# Patient Record
Sex: Male | Born: 1945 | Race: White | Hispanic: No | Marital: Married
Health system: Southern US, Community
[De-identification: ages and names within clinical notes are randomized; demographics above are authoritative.]

---

## 1999-05-17 ENCOUNTER — Ambulatory Visit (HOSPITAL_COMMUNITY): Admission: RE | Admit: 1999-05-17 | Discharge: 1999-05-17 | Payer: Self-pay | Admitting: Orthopedic Surgery

## 1999-05-17 ENCOUNTER — Encounter: Payer: Self-pay | Admitting: Orthopedic Surgery

## 1999-06-13 ENCOUNTER — Ambulatory Visit (HOSPITAL_COMMUNITY): Admission: RE | Admit: 1999-06-13 | Discharge: 1999-06-13 | Payer: Self-pay | Admitting: Orthopedic Surgery

## 1999-06-13 ENCOUNTER — Encounter: Payer: Self-pay | Admitting: Orthopedic Surgery

## 1999-06-29 ENCOUNTER — Ambulatory Visit (HOSPITAL_COMMUNITY): Admission: RE | Admit: 1999-06-29 | Discharge: 1999-06-29 | Payer: Self-pay | Admitting: Orthopedic Surgery

## 1999-06-29 ENCOUNTER — Encounter: Payer: Self-pay | Admitting: Orthopedic Surgery

## 1999-07-19 ENCOUNTER — Ambulatory Visit (HOSPITAL_COMMUNITY): Admission: RE | Admit: 1999-07-19 | Discharge: 1999-07-19 | Payer: Self-pay | Admitting: Orthopedic Surgery

## 1999-07-19 ENCOUNTER — Encounter: Payer: Self-pay | Admitting: Orthopedic Surgery

## 1999-08-16 ENCOUNTER — Encounter: Payer: Self-pay | Admitting: Neurosurgery

## 1999-08-16 ENCOUNTER — Ambulatory Visit (HOSPITAL_COMMUNITY): Admission: RE | Admit: 1999-08-16 | Discharge: 1999-08-16 | Payer: Self-pay | Admitting: Neurosurgery

## 1999-11-07 ENCOUNTER — Encounter: Payer: Self-pay | Admitting: Neurosurgery

## 1999-11-07 ENCOUNTER — Encounter: Admission: RE | Admit: 1999-11-07 | Discharge: 1999-11-07 | Payer: Self-pay | Admitting: Neurosurgery

## 1999-11-22 ENCOUNTER — Encounter: Payer: Self-pay | Admitting: Neurosurgery

## 1999-11-23 ENCOUNTER — Inpatient Hospital Stay (HOSPITAL_COMMUNITY): Admission: RE | Admit: 1999-11-23 | Discharge: 1999-11-23 | Payer: Self-pay | Admitting: Neurosurgery

## 1999-11-23 ENCOUNTER — Encounter: Payer: Self-pay | Admitting: Neurosurgery

## 2008-09-30 ENCOUNTER — Ambulatory Visit (HOSPITAL_COMMUNITY): Admission: RE | Admit: 2008-09-30 | Discharge: 2008-09-30 | Payer: Self-pay | Admitting: General Surgery

## 2010-12-12 LAB — COMPREHENSIVE METABOLIC PANEL
ALT: 35 U/L (ref 0–53)
AST: 29 U/L (ref 0–37)
Albumin: 4.3 g/dL (ref 3.5–5.2)
Alkaline Phosphatase: 50 U/L (ref 39–117)
Chloride: 101 mEq/L (ref 96–112)
GFR calc Af Amer: >60 mL/min (ref >60)
Potassium: 3.6 mEq/L (ref 3.5–5.1)
Total Bilirubin: 1.3 mg/dL — ABNORMAL HIGH (ref 0.3–1.2)

## 2010-12-12 LAB — CBC
Platelets: 142 10*3/uL — ABNORMAL LOW (ref 150–400)
WBC: 4.7 10*3/uL (ref 4.0–10.5)

## 2010-12-12 LAB — DIFFERENTIAL
Basophils Absolute: 0 10*3/uL (ref 0.0–0.1)
Basophils Relative: 0 % (ref 0–1)
Eosinophils Relative: 2 % (ref 0–5)
Lymphocytes Relative: 28 % (ref 12–46)
Monocytes Absolute: 0.3 10*3/uL (ref 0.1–1.0)

## 2011-01-09 NOTE — Op Note (Signed)
[name], Jonathan Knight                ACCOUNT NO.:  [id number]   MEDICAL RECORD NO.:  [id number]          PATIENT TYPE:  AMB   LOCATION:  DAY                          FACILITY:  [facility]   PHYSICIAN:  Adolph Pollack, M.D.DATE OF BIRTH:  28-May-1946   DATE OF PROCEDURE:  09/30/2008  DATE OF DISCHARGE:                               OPERATIVE REPORT   PREOPERATIVE DIAGNOSIS:  Bilateral inguinal hernias with left side being  recurrent.   POSTOPERATIVE DIAGNOSIS:  Bilateral inguinal hernias with left side  being recurrent.   PROCEDURE:  Laparoscopic bilateral inguinal hernia repair with mesh.   SURGEON:  Avel Peace, MD   ANESTHESIA:  General.   INDICATIONS:  This is a 65 year old male who had a left inguinal hernia  repair over 20 years ago.  He says for the past 15 years he has noticed  a recurrence of the hernia and now has a little bit of discomfort on the  right side.  On physical examination, he has a recurrent left inguinal  hernia and a new right inguinal hernia.  He now presents for  laparoscopic repair.   TECHNIQUE:  He voided then was brought to the operating room, placed  supine on the operating table, and a general anesthetic was  administered.  The hair in the lower abdomen and groin was clipped and  the area sterilely prepped and draped.  Marcaine was infiltrated in the  subumbilical region.  A subumbilical incision was made through the skin  and subcutaneous tissue.  The right anterior rectus sheath was  identified and a small incision made in it.  The right rectus muscle was  swept laterally exposing the posterior rectus sheath.  A balloon  dissection device was then placed in the extraperitoneal space under  laparoscopic vision. Balloon dissection was performed of the right and  left extraperitoneal space.  Following this, the balloon was removed and  CO2 gas was insufflated.  Under direct vision two 5-mm trocars were  placed in the lower midline.   I  approached the right side and identified the symphysis pubis and  Cooper's ligament.  Using blunt dissection, I separated the fibrofatty  tissue away from the lateral and anterior abdominal wall to the level of  the umbilicus.  The direct space was solid.  I then isolated the  spermatic cord and noted an indirect hernia sac.  I used careful blunt  dissection to strip the sac away from the cord back to the level of the  umbilicus and create a window around the cord.   I then approached the left side and noted a recurrent hernia.  I reduced  this with external manual compression.  I then dissected the fibrofatty  tissue free from the anterior and lateral abdominal wall.  I then began  dissecting the hernia contents free and it looked like it was a direct  defect.  There is a small tear made in the peritoneum creating a  pneumoperitoneum and this was reduced with a Veress needle placed next  to the original subumbilical trocar.  I was able to fold the  peritoneum  back on itself and seal the defect.  I then removed the extraperitoneal  fatty contents and hernia contents free from the direct sac and reduced  this back into the extraperitoneal space. I then isolated the spermatic  cord vessels and created a window around them.  I stripped some  peritoneum back to the level of the umbilicus.  I then used blunt  dissection to expose Cooper's ligament.   Following this, I brought up a 6 x 6 inch piece of Parietex TECR  mesh  into the field and cut it to be 5 inches x 6 inches.  A partial  longitudinal slit was cut into it.  It was then placed into left  extraperitoneal space and positioned so that the 2 tails were wrapped  around the cord.  It was then anchored to Cooper's ligament and the  anterior and lateral abdominal walls with spiral tacks.  This provided  for adequate coverage with good overlap of the direct, indirect, and  femoral spaces.   Following this, I took a 6 x 6 inch piece of  Parietex TECR mesh and cut  it to be 5 x 6.  A third cut a partial longitudinal slit into it as well  and placed it into the right extraperitoneal space.  It was positioned  so that the 2 tails were wrapped around the spermatic cord.  It was then  anchored to Cooper's ligament and the anterior and lateral abdominal  walls with spiral tacks.  It provided for adequate coverage with good  overlap of the direct, indirect, and femoral spaces.   I then inspected the area and evacuated some old blood.  No active  bleeding was noted.  The inferolateral aspect of ease piece of mesh was  then held with a blunt instrument and the CO2 gas released.  The  instruments were removed. The Veress needle was removed, and all trocars  were removed.   The right anterior rectus sheath defect was closed with interrupted zero  Vicryl sutures.  The skin incisions were closed with 4-0 Monocryl  subcuticular stitches.  Steri-Strips and sterile dressings were applied.  He tolerated the procedure without any apparent complications and was  taken to recovery room in satisfactory condition.      Adolph Pollack, M.D.  Electronically Signed     TJR/MEDQ  D:  09/30/2008  T:  09/30/2008  Job:  [id number]   cc:   Oley Balm. Georgina Pillion, M.D.  Fax: [phone]

## 2011-01-12 NOTE — H&P (Signed)
[place]. [facility]  Patient:    Jonathan Knight                          MRN: [id number] Adm. Date:  [id number] Disc. Date: [id number] Attending:  Colon Branch                         History and Physical  CHIEF COMPLAINT:  Right leg pain.  HISTORY:  The patient is a 65 year old right-handed gentleman who, one year ago, started having some back pain and then, over the summer, started having some pain in the right hip and down his right leg towards the lateral ankle.  He ended up  getting an MRI of his lumbar spine and then underwent epidural steroid injections, which did give him some temporary relief.  The pain continued, along with some tingling, towards the top of his foot and big toe.  We started seeing him in December.  Myelogram of his lumbar spine was obtained.  He was treated with nonsteroidal anti-inflammatories and things did start improving a bit again. This happened until February, where he started having a terrible right leg pain, unrelenting, even while back on the Naprosyn.  Mattie Marlin helped things a little but the pain then worsened and a burning pain in the calf also started.  He started have a little weakness in ______ hallucis longus and the patient will be admitted for surgery.  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  There is no movement of the area either with flexion and extension.  The patient is going to be admitted for surgical decompression.  PAST MEDICAL HISTORY:  Significant for hypertension and high cholesterol.  PREVIOUS OPERATIONS:  Hernia repair in 1987.  CURRENT MEDICATIONS:  Captopril, Lipitor and Naprosyn p.r.n.  ALLERGIES:  No known drug allergies.  SOCIAL HISTORY:  She is married.  She does not smoke; drinks alcohol occasionally.  REVIEW OF SYSTEMS:  Otherwise negative.  FAMILY  HISTORY:  Noncontributory.  PHYSICAL EXAMINATION:  GENERAL:  The patient is pleasant, but is in some mild distress.  HEENT:  Unremarkable.  NECK:  Supple.  LUNGS:  Clear.  HEART:  Regular rhythm.  ABDOMEN:  Soft, nontender.  EXTREMITIES:  Intact; no edema.  BACK/NEUROLOGIC:  Exam shows decreased range of motion of the back, especially where there is lateral bending because of radicular pain.  There is a positive straight leg raise on the right and negative on the left.  Sensation is slightly decreased in the right L5 distribution.  There is some slight weakness in the right ______ hallucis longus.  Dorsiflexion seems intact.  He can ambulate on heels, with his toes up.  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  PLAN:  The patient will be admitted for semi-hemilaminectomy and removal of epidural mass. DD:  11/22/99 TD:  11/22/99 Job: [id number]

## 2011-01-12 NOTE — Op Note (Signed)
[place]. [facility]  Patient:    Jonathan Knight, Jonathan Knight                         MRN: [id number] Proc. Date: 11/23/99 Adm. 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  POSTOPERATIVE DIAGNOSIS:  Right L4-5 synovial cyst cause a right L5 radiculopathy.  OPERATION PERFORMED:  Right L4-5 semihemilaminectomy and removal of epidural mass/synovial cyst with microscope for microdissection.  SURGEON:  Clydene Fake, M.D.  ASSISTANT:  Izell Ettrick. Elesa Hacker, M.D.  ANESTHESIA:  General endotracheal.  ESTIMATED BLOOD LOSS:  ____________  BLOOD GIVEN:  None.  DRAINS:  None.  COMPLICATIONS:  None.  INDICATIONS FOR PROCEDURE:  The patient is a 65 year old gentleman who has had ____________ right leg pain going to the top of his foot which has had temporary relief with epidural steroids and oral steroids, nonsteroidal anti-inflammatories. He continues having recurrent episodes and each time having worsening symptoms which is worsening weakness, starting to get extensor hallucis longus weakness nd more dense numbness.  MRI and then myelogram of the lumbar spine shows synovial  cyst of L4-5 on the right compressing the L5 root.  There was mild spondylolisthesis at level that does not move with flexion and extension. Patient brought in for decompression with removal of the mass.  DESCRIPTION OF PROCEDURE:  The patient was brought to the operating room and general anesthesia was induced.  The patient was placed in a Wilson frame with ll pressure points padded in a prone position.  The patient was prepped and draped in a sterile fashion.  The site of incision was then injected with 10 cc of 1% lidocaine with epinephrine.  A needle was placed in the midline in the interspace. An x-ray was obtained showing this was the 3-4 interspace.  An incision  was then made centered one interspace lower in the midline in the lumbar spine. Hemostasis was obtained with Bovie cauterization.  The fascia was incised over the L4 and  spinous processes and subperiosteal dissection was done over the L4 and L5 spinous processes of the lamina out to the facet.  Marker was placed showing this was the 4-5 interspace.  The self-retaining retractor system was then replaced. Microscope was brought into the field to perform microdissection and a Midas Rex drill was  used along with Kerrison punches to perform a semihemilaminectomy in the L4-5 area. The ligamentum flavum was then removed and then re-explored lateral area of the 5 root and found synovial cyst which was removed piecemeal.  After decompression f that, foraminotomy was done on the 5 root.  We explored the disk space.  There as just a mild bulge.  The disk was left intact.  Hemostasis in the epidural space was obtained with bipolar cauterization and Gelfoam and thrombin.  There was no pressure on the L5 root.  The foramen was wide open and the 4 root was also decompressed.  Gelfoam was irrigated out with antibiotic solution and then the retractor removed.  Microscope removed from the field and the fascia closed with 0 Vicryl interrupted sutures and the subcutaneous tissues closed with 0, 2-0 and -0 Vicryl interrupted sutures.  Steri-Strips were then placed.  ____________ placed. Patient placed back in a  supine position, awakened from anesthesia and transferred to the recovery room in stable condition. DD:  11/23/99 TD:  11/23/99 Job: [id number]

## 2011-01-12 NOTE — Discharge Summary (Signed)
[place]. [facility]  Patient:    SALAHUDDIN, ARISMENDEZ                         MRN: [id number] Adm. Date:  [id number] Disc. 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  PROCEDURE:  Right L4-5 semi-hemilaminectomy and removal of epidural mass (synovial cyst) with microscope for microdissection.  HISTORY OF PRESENT ILLNESS:  Patient is a 65 year old gentleman who has had, for about 9 months, multiple episodes of extreme right leg that resolves with steroids or nonsteroidal anti-inflammatories using epidural steroid injections.  It gets  better and things recur.  Each recurrence, symptoms are worse and, with this last recurrence, he is getting more numbness into the foot and some weakness in the extensor hallucis longus.  MRI and myelogram show a synovial cyst coming out of the joint at the right 4-5 ______ compressing the 5 root there on the right side. e also has mild spondylolisthesis there that does not move with flexion and extension.  Patient was admitted for surgical decompression.  HOSPITAL COURSE:  Patient was admitted the day of surgery and underwent the procedure without complications.  Postoperatively, patient was transferred to the recovery room and then to the floor.  There, he has been ambulating.  He has no leg pain, numbness, or weakness.  He is eating well, ambulating well, with minimal incisional pain, and dressings clean, dry, and intact.  He is going to be discharged home on November 23, 1999, in stable condition.  DISCHARGE MEDICATIONS:  Same as prehospitalization, plus hydrocodone p.r.n. and  Flexeril p.r.n.  DISCHARGE INSTRUCTIONS:  Diet as tolerated.  No sitting longer than 20 minutes. No bending, twisting, lifting.  No driving.  Keep incision dry for five days. Follow-up will be three weeks in my office. DD:  11/23/99 TD:  11/24/99 Job:  [id number]

## 2012-06-05 ENCOUNTER — Other Ambulatory Visit: Payer: Self-pay | Admitting: Family Medicine

## 2012-06-05 ENCOUNTER — Ambulatory Visit
Admission: RE | Admit: 2012-06-05 | Discharge: 2012-06-05 | Disposition: A | Discharge status: Home / Self Care | Payer: BC Managed Care – PPO | Source: Ambulatory Visit | Attending: Family Medicine | Admitting: Family Medicine

## 2012-06-05 DIAGNOSIS — R52 Pain, unspecified: Secondary | ICD-10-CM

## 2015-12-07 DIAGNOSIS — L723 Sebaceous cyst: Secondary | ICD-10-CM | POA: Diagnosis not present

## 2015-12-07 DIAGNOSIS — L570 Actinic keratosis: Secondary | ICD-10-CM | POA: Diagnosis not present

## 2015-12-07 DIAGNOSIS — Z85828 Personal history of other malignant neoplasm of skin: Secondary | ICD-10-CM | POA: Diagnosis not present

## 2015-12-07 DIAGNOSIS — L821 Other seborrheic keratosis: Secondary | ICD-10-CM | POA: Diagnosis not present

## 2015-12-07 DIAGNOSIS — D2361 Other benign neoplasm of skin of right upper limb, including shoulder: Secondary | ICD-10-CM | POA: Diagnosis not present

## 2016-01-16 DIAGNOSIS — E7800 Pure hypercholesterolemia, unspecified: Secondary | ICD-10-CM | POA: Diagnosis not present

## 2016-01-16 DIAGNOSIS — I10 Essential (primary) hypertension: Secondary | ICD-10-CM | POA: Diagnosis not present

## 2016-01-16 DIAGNOSIS — Z125 Encounter for screening for malignant neoplasm of prostate: Secondary | ICD-10-CM | POA: Diagnosis not present

## 2016-01-16 DIAGNOSIS — R7303 Prediabetes: Secondary | ICD-10-CM | POA: Diagnosis not present

## 2016-01-16 DIAGNOSIS — Z0000 Encounter for general adult medical examination without abnormal findings: Secondary | ICD-10-CM | POA: Diagnosis not present

## 2016-03-27 DIAGNOSIS — K621 Rectal polyp: Secondary | ICD-10-CM | POA: Diagnosis not present

## 2016-03-27 DIAGNOSIS — K5730 Diverticulosis of large intestine without perforation or abscess without bleeding: Secondary | ICD-10-CM | POA: Diagnosis not present

## 2016-03-27 DIAGNOSIS — D128 Benign neoplasm of rectum: Secondary | ICD-10-CM | POA: Diagnosis not present

## 2016-03-27 DIAGNOSIS — Z86010 Personal history of colonic polyps: Secondary | ICD-10-CM | POA: Diagnosis not present

## 2016-03-27 DIAGNOSIS — D126 Benign neoplasm of colon, unspecified: Secondary | ICD-10-CM | POA: Diagnosis not present

## 2016-04-04 DIAGNOSIS — H5213 Myopia, bilateral: Secondary | ICD-10-CM | POA: Diagnosis not present

## 2016-04-04 DIAGNOSIS — H52223 Regular astigmatism, bilateral: Secondary | ICD-10-CM | POA: Diagnosis not present

## 2016-04-04 DIAGNOSIS — H04123 Dry eye syndrome of bilateral lacrimal glands: Secondary | ICD-10-CM | POA: Diagnosis not present

## 2016-06-05 DIAGNOSIS — Z23 Encounter for immunization: Secondary | ICD-10-CM | POA: Diagnosis not present

## 2016-07-16 DIAGNOSIS — S39012A Strain of muscle, fascia and tendon of lower back, initial encounter: Secondary | ICD-10-CM | POA: Diagnosis not present

## 2016-10-10 DIAGNOSIS — L821 Other seborrheic keratosis: Secondary | ICD-10-CM | POA: Diagnosis not present

## 2016-10-10 DIAGNOSIS — L720 Epidermal cyst: Secondary | ICD-10-CM | POA: Diagnosis not present

## 2016-10-10 DIAGNOSIS — D2361 Other benign neoplasm of skin of right upper limb, including shoulder: Secondary | ICD-10-CM | POA: Diagnosis not present

## 2016-10-10 DIAGNOSIS — Z85828 Personal history of other malignant neoplasm of skin: Secondary | ICD-10-CM | POA: Diagnosis not present

## 2017-01-17 DIAGNOSIS — E7800 Pure hypercholesterolemia, unspecified: Secondary | ICD-10-CM | POA: Diagnosis not present

## 2017-01-17 DIAGNOSIS — Z1159 Encounter for screening for other viral diseases: Secondary | ICD-10-CM | POA: Diagnosis not present

## 2017-01-17 DIAGNOSIS — I10 Essential (primary) hypertension: Secondary | ICD-10-CM | POA: Diagnosis not present

## 2017-01-17 DIAGNOSIS — Z0000 Encounter for general adult medical examination without abnormal findings: Secondary | ICD-10-CM | POA: Diagnosis not present

## 2017-02-04 DIAGNOSIS — Z85828 Personal history of other malignant neoplasm of skin: Secondary | ICD-10-CM | POA: Diagnosis not present

## 2017-02-04 DIAGNOSIS — L723 Sebaceous cyst: Secondary | ICD-10-CM | POA: Diagnosis not present

## 2017-02-04 DIAGNOSIS — L570 Actinic keratosis: Secondary | ICD-10-CM | POA: Diagnosis not present

## 2017-06-20 DIAGNOSIS — Z23 Encounter for immunization: Secondary | ICD-10-CM | POA: Diagnosis not present

## 2017-07-03 DIAGNOSIS — D2361 Other benign neoplasm of skin of right upper limb, including shoulder: Secondary | ICD-10-CM | POA: Diagnosis not present

## 2017-07-03 DIAGNOSIS — Z85828 Personal history of other malignant neoplasm of skin: Secondary | ICD-10-CM | POA: Diagnosis not present

## 2017-07-03 DIAGNOSIS — D1801 Hemangioma of skin and subcutaneous tissue: Secondary | ICD-10-CM | POA: Diagnosis not present

## 2017-07-03 DIAGNOSIS — L821 Other seborrheic keratosis: Secondary | ICD-10-CM | POA: Diagnosis not present

## 2017-07-03 DIAGNOSIS — D235 Other benign neoplasm of skin of trunk: Secondary | ICD-10-CM | POA: Diagnosis not present

## 2017-08-01 DIAGNOSIS — L720 Epidermal cyst: Secondary | ICD-10-CM | POA: Diagnosis not present

## 2017-08-01 DIAGNOSIS — Z85828 Personal history of other malignant neoplasm of skin: Secondary | ICD-10-CM | POA: Diagnosis not present

## 2017-12-11 DIAGNOSIS — D044 Carcinoma in situ of skin of scalp and neck: Secondary | ICD-10-CM | POA: Diagnosis not present

## 2017-12-11 DIAGNOSIS — D485 Neoplasm of uncertain behavior of skin: Secondary | ICD-10-CM | POA: Diagnosis not present

## 2017-12-11 DIAGNOSIS — Z85828 Personal history of other malignant neoplasm of skin: Secondary | ICD-10-CM | POA: Diagnosis not present

## 2018-01-22 DIAGNOSIS — I10 Essential (primary) hypertension: Secondary | ICD-10-CM | POA: Diagnosis not present

## 2018-01-22 DIAGNOSIS — Z125 Encounter for screening for malignant neoplasm of prostate: Secondary | ICD-10-CM | POA: Diagnosis not present

## 2018-01-22 DIAGNOSIS — E7800 Pure hypercholesterolemia, unspecified: Secondary | ICD-10-CM | POA: Diagnosis not present

## 2018-01-22 DIAGNOSIS — Z0000 Encounter for general adult medical examination without abnormal findings: Secondary | ICD-10-CM | POA: Diagnosis not present

## 2018-06-09 DIAGNOSIS — Z23 Encounter for immunization: Secondary | ICD-10-CM | POA: Diagnosis not present

## 2018-07-07 DIAGNOSIS — D044 Carcinoma in situ of skin of scalp and neck: Secondary | ICD-10-CM | POA: Diagnosis not present

## 2018-07-07 DIAGNOSIS — C44619 Basal cell carcinoma of skin of left upper limb, including shoulder: Secondary | ICD-10-CM | POA: Diagnosis not present

## 2018-07-07 DIAGNOSIS — L720 Epidermal cyst: Secondary | ICD-10-CM | POA: Diagnosis not present

## 2018-07-07 DIAGNOSIS — Z85828 Personal history of other malignant neoplasm of skin: Secondary | ICD-10-CM | POA: Diagnosis not present

## 2018-07-07 DIAGNOSIS — D485 Neoplasm of uncertain behavior of skin: Secondary | ICD-10-CM | POA: Diagnosis not present

## 2018-07-07 DIAGNOSIS — L821 Other seborrheic keratosis: Secondary | ICD-10-CM | POA: Diagnosis not present

## 2018-10-14 DIAGNOSIS — H3530 Unspecified macular degeneration: Secondary | ICD-10-CM | POA: Diagnosis not present

## 2018-10-14 DIAGNOSIS — H2513 Age-related nuclear cataract, bilateral: Secondary | ICD-10-CM | POA: Diagnosis not present

## 2018-10-14 DIAGNOSIS — H353131 Nonexudative age-related macular degeneration, bilateral, early dry stage: Secondary | ICD-10-CM | POA: Diagnosis not present

## 2019-01-14 DIAGNOSIS — D2361 Other benign neoplasm of skin of right upper limb, including shoulder: Secondary | ICD-10-CM | POA: Diagnosis not present

## 2019-01-14 DIAGNOSIS — D485 Neoplasm of uncertain behavior of skin: Secondary | ICD-10-CM | POA: Diagnosis not present

## 2019-01-14 DIAGNOSIS — Z85828 Personal history of other malignant neoplasm of skin: Secondary | ICD-10-CM | POA: Diagnosis not present

## 2019-01-14 DIAGNOSIS — C44311 Basal cell carcinoma of skin of nose: Secondary | ICD-10-CM | POA: Diagnosis not present

## 2019-01-14 DIAGNOSIS — L821 Other seborrheic keratosis: Secondary | ICD-10-CM | POA: Diagnosis not present

## 2019-02-12 DIAGNOSIS — Z0000 Encounter for general adult medical examination without abnormal findings: Secondary | ICD-10-CM | POA: Diagnosis not present

## 2019-02-12 DIAGNOSIS — E7800 Pure hypercholesterolemia, unspecified: Secondary | ICD-10-CM | POA: Diagnosis not present

## 2019-02-12 DIAGNOSIS — Z125 Encounter for screening for malignant neoplasm of prostate: Secondary | ICD-10-CM | POA: Diagnosis not present

## 2019-02-12 DIAGNOSIS — I10 Essential (primary) hypertension: Secondary | ICD-10-CM | POA: Diagnosis not present

## 2019-05-26 DIAGNOSIS — Z23 Encounter for immunization: Secondary | ICD-10-CM | POA: Diagnosis not present

## 2019-09-22 ENCOUNTER — Ambulatory Visit: Payer: Self-pay

## 2019-10-01 ENCOUNTER — Ambulatory Visit: Payer: Medicare Other | Attending: Internal Medicine

## 2019-10-01 DIAGNOSIS — Z23 Encounter for immunization: Secondary | ICD-10-CM | POA: Insufficient documentation

## 2019-10-01 NOTE — Progress Notes (Signed)
   Covid-19 Vaccination Clinic  Name:  Jonathan Knight    MRN: [id number] DOB: 12-24-1945  10/01/2019  [name] was observed post Covid-19 immunization for 15 minutes without incidence. He was provided with Vaccine Information Sheet and instruction to access the V-Safe system.   Mr. Chain was instructed to call 911 with any severe reactions post vaccine: Marland Kitchen Difficulty breathing  . Swelling of your face and throat  . A fast heartbeat  . A bad rash all over your body  . Dizziness and weakness    Immunizations Administered    Name Date Dose VIS Date Route   Pfizer COVID-19 Vaccine 10/01/2019  5:14 PM 0.3 mL 08/07/2019 Intramuscular   Manufacturer: [employer], [employer]   Lot: [id number]   NDC: [address]-2

## 2019-10-02 ENCOUNTER — Ambulatory Visit: Payer: Self-pay

## 2019-10-20 DIAGNOSIS — L821 Other seborrheic keratosis: Secondary | ICD-10-CM | POA: Diagnosis not present

## 2019-10-20 DIAGNOSIS — L723 Sebaceous cyst: Secondary | ICD-10-CM | POA: Diagnosis not present

## 2019-10-20 DIAGNOSIS — D485 Neoplasm of uncertain behavior of skin: Secondary | ICD-10-CM | POA: Diagnosis not present

## 2019-10-20 DIAGNOSIS — Z85828 Personal history of other malignant neoplasm of skin: Secondary | ICD-10-CM | POA: Diagnosis not present

## 2019-10-20 DIAGNOSIS — D0439 Carcinoma in situ of skin of other parts of face: Secondary | ICD-10-CM | POA: Diagnosis not present

## 2019-10-26 ENCOUNTER — Ambulatory Visit: Payer: Medicare Other | Attending: Internal Medicine

## 2019-10-26 DIAGNOSIS — Z23 Encounter for immunization: Secondary | ICD-10-CM

## 2019-10-26 NOTE — Progress Notes (Signed)
   Covid-19 Vaccination Clinic  Name:  Jonathan Knight    MRN: [id number] DOB: [birth year]  10/26/2019  [name] was observed post Covid-19 immunization for 15 minutes without incidence. He was provided with Vaccine Information Sheet and instruction to access the V-Safe system.   [name] was instructed to call 911 with any severe reactions post vaccine: Marland Kitchen Difficulty breathing  . Swelling of your face and throat  . A fast heartbeat  . A bad rash all over your body  . Dizziness and weakness    Immunizations Administered    Name Date Dose VIS Date Route   Pfizer COVID-19 Vaccine 10/26/2019  2:21 PM 0.3 mL 08/07/2019 Intramuscular   Manufacturer: [employer], [employer]   Lot: [id number]   NDC: [address]-1

## 2020-01-18 DIAGNOSIS — S46812A Strain of other muscles, fascia and tendons at shoulder and upper arm level, left arm, initial encounter: Secondary | ICD-10-CM | POA: Diagnosis not present

## 2020-02-17 DIAGNOSIS — E7800 Pure hypercholesterolemia, unspecified: Secondary | ICD-10-CM | POA: Diagnosis not present

## 2020-02-17 DIAGNOSIS — I10 Essential (primary) hypertension: Secondary | ICD-10-CM | POA: Diagnosis not present

## 2020-02-17 DIAGNOSIS — R5382 Chronic fatigue, unspecified: Secondary | ICD-10-CM | POA: Diagnosis not present

## 2020-02-17 DIAGNOSIS — Z0000 Encounter for general adult medical examination without abnormal findings: Secondary | ICD-10-CM | POA: Diagnosis not present

## 2020-02-18 ENCOUNTER — Other Ambulatory Visit: Payer: Self-pay | Admitting: Family Medicine

## 2020-02-18 DIAGNOSIS — R590 Localized enlarged lymph nodes: Secondary | ICD-10-CM

## 2020-02-19 DIAGNOSIS — H5212 Myopia, left eye: Secondary | ICD-10-CM | POA: Diagnosis not present

## 2020-02-22 ENCOUNTER — Other Ambulatory Visit: Payer: Self-pay | Admitting: Family Medicine

## 2020-02-22 DIAGNOSIS — R590 Localized enlarged lymph nodes: Secondary | ICD-10-CM

## 2020-02-22 DIAGNOSIS — R5382 Chronic fatigue, unspecified: Secondary | ICD-10-CM

## 2020-02-25 ENCOUNTER — Ambulatory Visit
Admission: RE | Admit: 2020-02-25 | Discharge: 2020-02-25 | Disposition: A | Discharge status: Home / Self Care | Payer: Medicare Other | Source: Ambulatory Visit | Attending: Family Medicine | Admitting: Family Medicine

## 2020-02-25 DIAGNOSIS — R590 Localized enlarged lymph nodes: Secondary | ICD-10-CM

## 2020-02-25 DIAGNOSIS — R5382 Chronic fatigue, unspecified: Secondary | ICD-10-CM

## 2020-07-25 DIAGNOSIS — L821 Other seborrheic keratosis: Secondary | ICD-10-CM | POA: Diagnosis not present

## 2020-07-25 DIAGNOSIS — L570 Actinic keratosis: Secondary | ICD-10-CM | POA: Diagnosis not present

## 2020-07-25 DIAGNOSIS — L720 Epidermal cyst: Secondary | ICD-10-CM | POA: Diagnosis not present

## 2020-07-25 DIAGNOSIS — Z85828 Personal history of other malignant neoplasm of skin: Secondary | ICD-10-CM | POA: Diagnosis not present

## 2021-03-07 DIAGNOSIS — E7800 Pure hypercholesterolemia, unspecified: Secondary | ICD-10-CM | POA: Diagnosis not present

## 2021-03-07 DIAGNOSIS — Z0000 Encounter for general adult medical examination without abnormal findings: Secondary | ICD-10-CM | POA: Diagnosis not present

## 2021-03-07 DIAGNOSIS — R5382 Chronic fatigue, unspecified: Secondary | ICD-10-CM | POA: Diagnosis not present

## 2021-03-07 DIAGNOSIS — I10 Essential (primary) hypertension: Secondary | ICD-10-CM | POA: Diagnosis not present

## 2021-03-08 ENCOUNTER — Other Ambulatory Visit: Payer: Self-pay | Admitting: Family Medicine

## 2021-03-08 ENCOUNTER — Ambulatory Visit
Admission: RE | Admit: 2021-03-08 | Discharge: 2021-03-08 | Disposition: A | Discharge status: Home / Self Care | Payer: Medicare Other | Source: Ambulatory Visit | Attending: Family Medicine | Admitting: Family Medicine

## 2021-03-08 DIAGNOSIS — M542 Cervicalgia: Secondary | ICD-10-CM

## 2021-03-08 DIAGNOSIS — I700 Atherosclerosis of aorta: Secondary | ICD-10-CM | POA: Diagnosis not present

## 2021-03-08 DIAGNOSIS — M47812 Spondylosis without myelopathy or radiculopathy, cervical region: Secondary | ICD-10-CM | POA: Diagnosis not present

## 2021-03-10 DIAGNOSIS — H5212 Myopia, left eye: Secondary | ICD-10-CM | POA: Diagnosis not present

## 2021-03-22 DIAGNOSIS — M542 Cervicalgia: Secondary | ICD-10-CM | POA: Diagnosis not present

## 2021-04-03 DIAGNOSIS — M542 Cervicalgia: Secondary | ICD-10-CM | POA: Diagnosis not present

## 2021-04-24 DIAGNOSIS — L720 Epidermal cyst: Secondary | ICD-10-CM | POA: Diagnosis not present

## 2021-04-24 DIAGNOSIS — D1801 Hemangioma of skin and subcutaneous tissue: Secondary | ICD-10-CM | POA: Diagnosis not present

## 2021-04-24 DIAGNOSIS — Z85828 Personal history of other malignant neoplasm of skin: Secondary | ICD-10-CM | POA: Diagnosis not present

## 2021-04-24 DIAGNOSIS — L821 Other seborrheic keratosis: Secondary | ICD-10-CM | POA: Diagnosis not present

## 2021-04-24 DIAGNOSIS — D044 Carcinoma in situ of skin of scalp and neck: Secondary | ICD-10-CM | POA: Diagnosis not present

## 2021-05-15 DIAGNOSIS — U071 COVID-19: Secondary | ICD-10-CM | POA: Diagnosis not present

## 2021-07-27 DIAGNOSIS — R051 Acute cough: Secondary | ICD-10-CM | POA: Diagnosis not present

## 2021-09-11 DIAGNOSIS — H40013 Open angle with borderline findings, low risk, bilateral: Secondary | ICD-10-CM | POA: Diagnosis not present

## 2021-09-11 DIAGNOSIS — H5212 Myopia, left eye: Secondary | ICD-10-CM | POA: Diagnosis not present

## 2021-09-11 DIAGNOSIS — H52223 Regular astigmatism, bilateral: Secondary | ICD-10-CM | POA: Diagnosis not present

## 2021-09-11 DIAGNOSIS — H5201 Hypermetropia, right eye: Secondary | ICD-10-CM | POA: Diagnosis not present

## 2022-01-25 DIAGNOSIS — Z85828 Personal history of other malignant neoplasm of skin: Secondary | ICD-10-CM | POA: Diagnosis not present

## 2022-01-25 DIAGNOSIS — L821 Other seborrheic keratosis: Secondary | ICD-10-CM | POA: Diagnosis not present

## 2022-01-25 DIAGNOSIS — D1801 Hemangioma of skin and subcutaneous tissue: Secondary | ICD-10-CM | POA: Diagnosis not present

## 2022-01-25 DIAGNOSIS — L570 Actinic keratosis: Secondary | ICD-10-CM | POA: Diagnosis not present

## 2022-04-17 DIAGNOSIS — E7800 Pure hypercholesterolemia, unspecified: Secondary | ICD-10-CM | POA: Diagnosis not present

## 2022-04-17 DIAGNOSIS — Z125 Encounter for screening for malignant neoplasm of prostate: Secondary | ICD-10-CM | POA: Diagnosis not present

## 2022-04-17 DIAGNOSIS — Z0000 Encounter for general adult medical examination without abnormal findings: Secondary | ICD-10-CM | POA: Diagnosis not present

## 2022-04-17 DIAGNOSIS — R7303 Prediabetes: Secondary | ICD-10-CM | POA: Diagnosis not present

## 2022-04-17 DIAGNOSIS — I10 Essential (primary) hypertension: Secondary | ICD-10-CM | POA: Diagnosis not present

## 2022-06-11 DIAGNOSIS — H5212 Myopia, left eye: Secondary | ICD-10-CM | POA: Diagnosis not present

## 2022-09-27 DIAGNOSIS — D1801 Hemangioma of skin and subcutaneous tissue: Secondary | ICD-10-CM | POA: Diagnosis not present

## 2022-09-27 DIAGNOSIS — Z85828 Personal history of other malignant neoplasm of skin: Secondary | ICD-10-CM | POA: Diagnosis not present

## 2022-09-27 DIAGNOSIS — L720 Epidermal cyst: Secondary | ICD-10-CM | POA: Diagnosis not present

## 2022-09-27 DIAGNOSIS — L570 Actinic keratosis: Secondary | ICD-10-CM | POA: Diagnosis not present

## 2022-09-27 DIAGNOSIS — L821 Other seborrheic keratosis: Secondary | ICD-10-CM | POA: Diagnosis not present

## 2022-09-29 IMAGING — CR DG CERVICAL SPINE 2 OR 3 VIEWS
4 series · 4 of 4 positions shown · non-contrast
Comparison: September 28, 2008

CLINICAL DATA: Pain

EXAM:
CERVICAL SPINE - 2-3 VIEW

[w cervical spine lat]
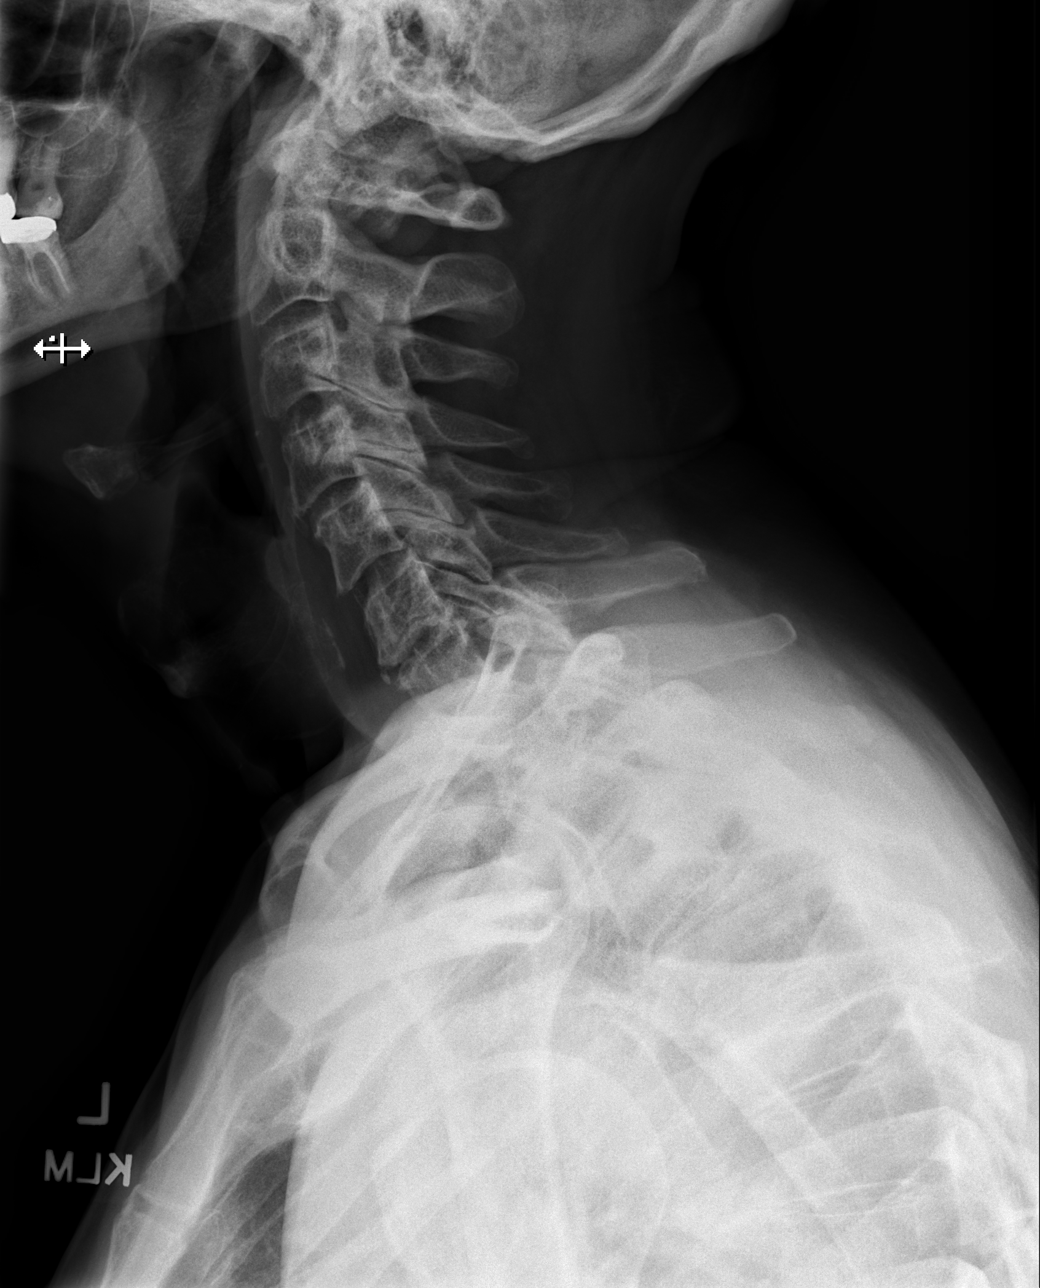

[w cervical spine ap]
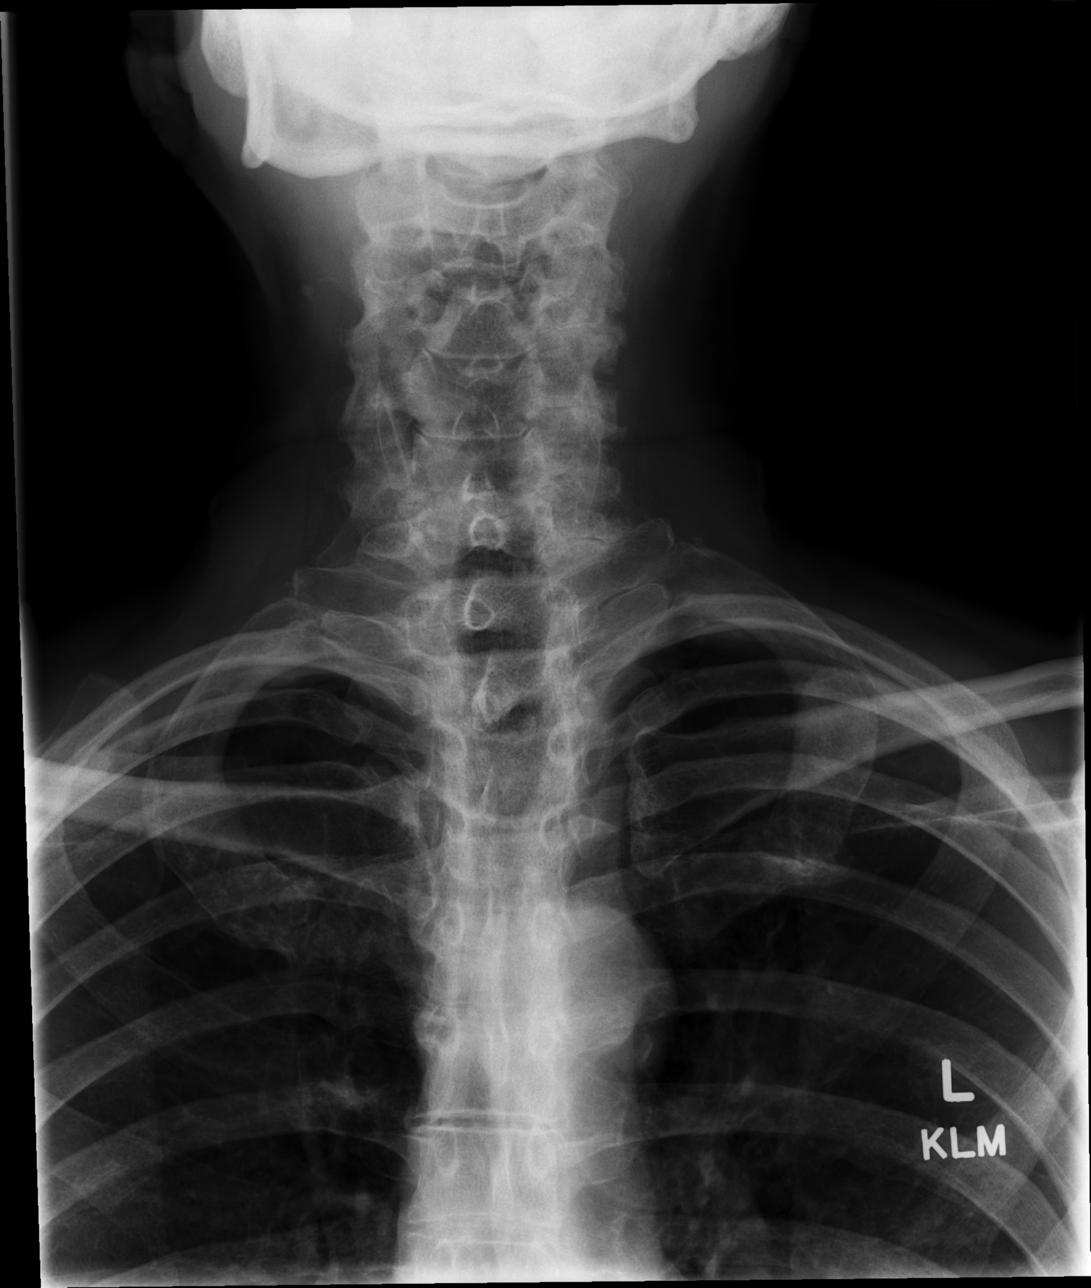

[w cervical spine odontoid]
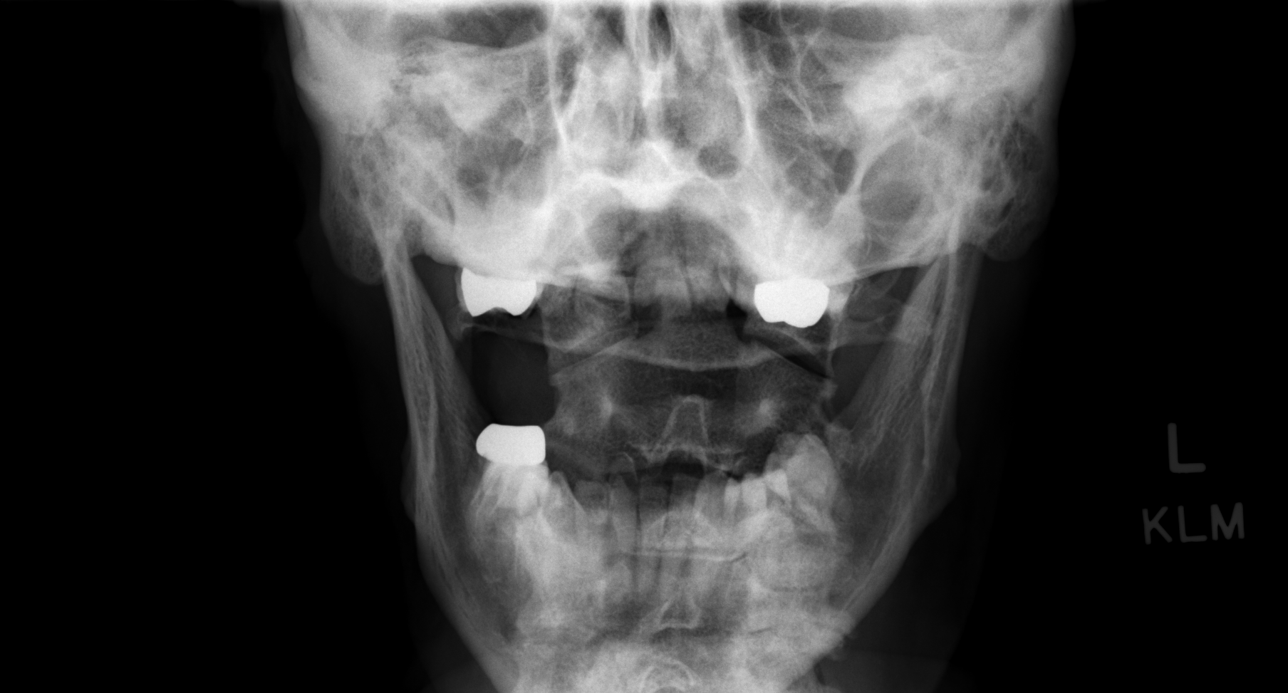

[w cervical swimmers]
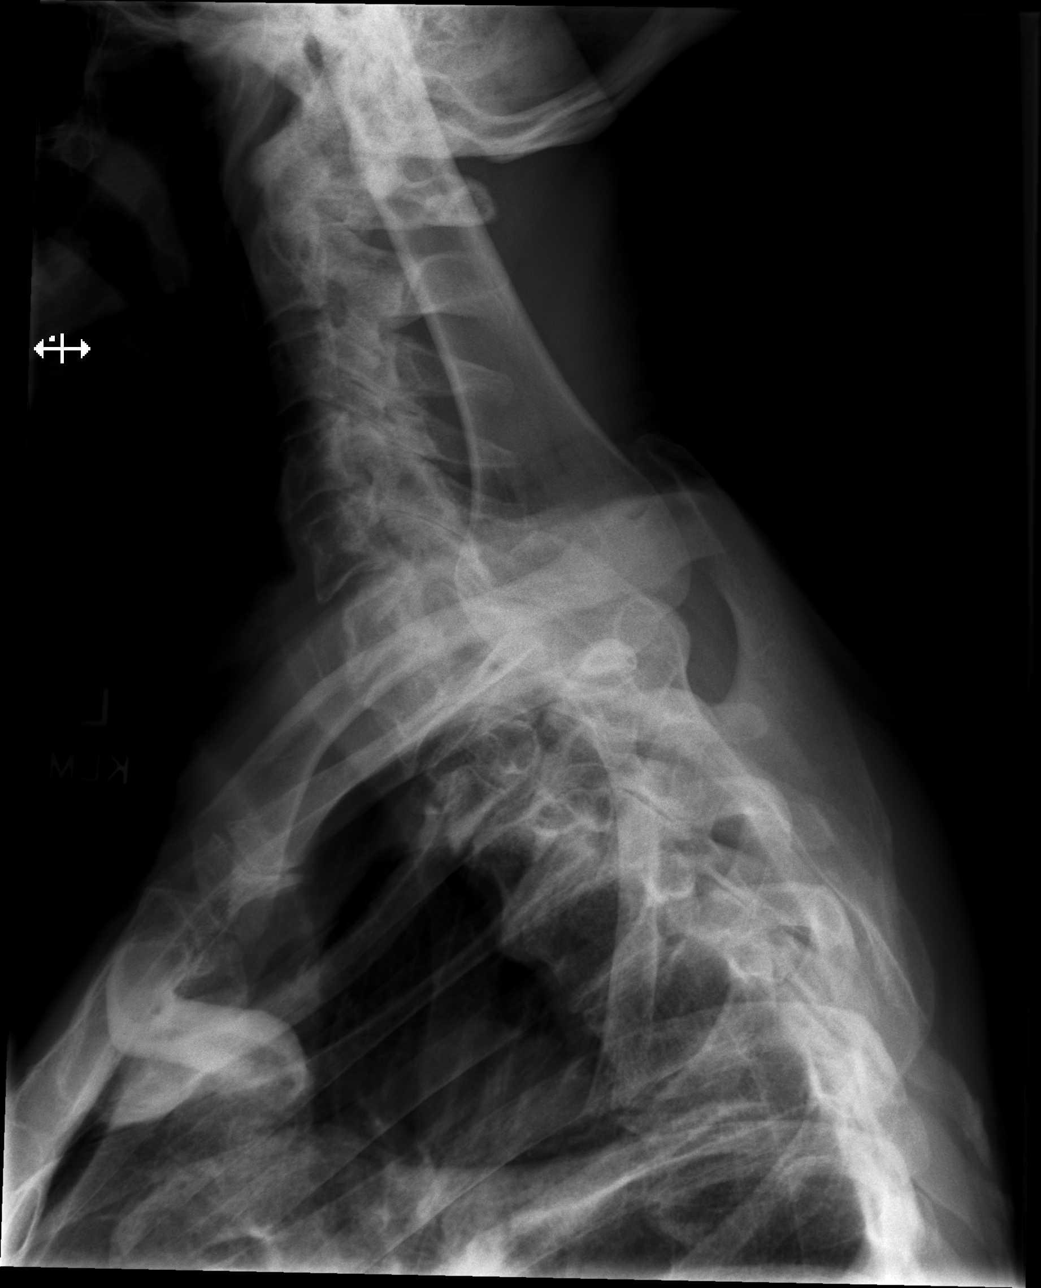

[4 of 4 positions shown; findings below may reference images not displayed]

FINDINGS: The cervical spine is visualized from C1-the superior endplate of
C7. Cervical alignment is maintained. Vertebral body heights are
maintained: no evidence of acute fracture. Moderate intervertebral
disc space height loss with uncovertebral hypertrophy of C6-7. LEFT
greater than RIGHT facet arthropathy. No prevertebral soft tissue
swelling. Atherosclerotic calcifications of the aorta. Similar
biapical scarring.
IMPRESSION: Moderate degenerative changes most pronounced at C6-7.

## 2022-10-01 ENCOUNTER — Ambulatory Visit
Admission: RE | Admit: 2022-10-01 | Discharge: 2022-10-01 | Disposition: A | Discharge status: Home / Self Care | Payer: Medicare Other | Source: Ambulatory Visit | Attending: Family Medicine | Admitting: Family Medicine

## 2022-10-01 ENCOUNTER — Other Ambulatory Visit: Payer: Self-pay | Admitting: Family Medicine

## 2022-10-01 DIAGNOSIS — M25551 Pain in right hip: Secondary | ICD-10-CM | POA: Diagnosis not present

## 2022-10-01 DIAGNOSIS — M545 Low back pain, unspecified: Secondary | ICD-10-CM

## 2022-10-08 NOTE — Therapy (Unsigned)
OUTPATIENT PHYSICAL THERAPY THORACOLUMBAR EVALUATION   Patient Name: Jonathan Knight MRN: [id number] DOB:December 18, 1945, 77 y.o., male Today's Date: 10/09/2022  END OF SESSION:  PT End of Session - 10/09/22 0941     Visit Number 1    Number of Visits 16    Date for PT Re-Evaluation 11/20/22    Authorization Type [employer]    Progress Note Due on Visit 10    PT Start Time 0845    PT Stop Time 0930    PT Time Calculation (min) 45 min    Activity Tolerance Patient tolerated treatment well    Behavior During Therapy [facility] for tasks assessed/performed             History reviewed. No pertinent past medical history. History reviewed. No pertinent surgical history. There are no problems to display for this patient.  REFERRING PROVIDER: Saintclair Halsted, FNP  REFERRING DIAG: Acute right-sided low back pain without sciatica [M54.50]   Rationale for Evaluation and Treatment: Rehabilitation  THERAPY DIAG:  Other low back pain - Plan: PT plan of care cert/re-cert  Muscle weakness (generalized) - Plan: PT plan of care cert/re-cert  Other abnormalities of gait and mobility  ONSET DATE: 3-4 weeks ago  SUBJECTIVE:                                                                                                                                                                                           SUBJECTIVE STATEMENT: Pt reports an insidious onset of L hip/ LBP that started about 4 weeks ago. He noted the pain when walking 1.5 miles with his dog. Pt notes relief with rest. He takes tylenol intermittently for pain. He is an avid golfer, but does not note pain with those movements.   PERTINENT HISTORY:  HTN  PAIN:  Are you having pain? Yes: NPRS scale: 4-5/10 Pain location: L hip/ LBP  Pain description: Grabbing pain, stabbing pain Aggravating factors: Walking, sleeping.   Relieving factors: Rest, tylenol   PRECAUTIONS: None  WEIGHT BEARING RESTRICTIONS:  No  FALLS:  Has patient fallen in last 6 months? No  LIVING ENVIRONMENT: Lives with: lives with their spouse Lives in: House/apartment Stairs: Yes: Internal: 12 steps; on right going up Has following equipment at home: None  OCCUPATION: Retired   PLOF: Independent  PATIENT GOALS: Pt would like to get back to walking his dog without pain.   OBJECTIVE:   DIAGNOSTIC FINDINGS:  IMPRESSION: 1. No acute findings. 2. 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  PATIENT SURVEYS:  FOTO 54.93%, 73% in 9 visits.   SCREENING FOR RED  FLAGS: Bowel or bladder incontinence: No  COGNITION: Overall cognitive status: Within functional limits for tasks assessed     SENSATION: WFL  POSTURE: No Significant postural limitations  PALPATION: No tenderness to palpation.   LUMBAR ROM:   AROM eval  Flexion 75% Pt able to touch his toes, but has tightness in bilat HS.  Extension 75% with mild 1/10 pain  Right lateral flexion WFL  Left lateral flexion WFL  Right rotation WFL  Left rotation WFL   (Blank rows = not tested)  LOWER EXTREMITY ROM:     Active  Right eval Left eval  Hip flexion [facility]  Hip internal rotation 80% 90%   Hip external rotation [facility]  Knee flexion [facility] WFL  Knee extension WFL WFL   (Blank rows = not tested)  LOWER EXTREMITY MMT:    MMT Right eval Left eval  Hip flexion 5/5 5/5  Knee flexion 5/5 5/5  Knee extension 5/5 5/5  Hip Abduction  4+/5 4/5   (Blank rows = not tested)  FUNCTIONAL TESTS:  6 minute walk test: 1283f  - 2 min onset of pain, constant pain at 3 min. Pt reports wanting to stop at 5 min if he was walking his dog.   GAIT: Distance walked: 13037f Assistive device utilized: None Level of assistance: Complete Independence Comments: mild trendelenburg   TODAY'S TREATMENT:                                                                                                                              DATE: Creating, reviewing, and  completing below HEP    PATIENT EDUCATION:  Education details: Educated pt on anatomy and physiology of current symptoms, FOTO, diagnosis, prognosis, HEP,  and POC. Person educated: Patient Education method: ExCustomer service managerducation comprehension: verbalized understanding and returned demonstration  HOME EXERCISE PROGRAM: Access Code: [email or web address] URL: [email or web address] Date: 10/09/2022 Prepared by: SiRudi HeapExercises - Supine Lower Trunk Rotation  - 1 x daily - 7 x weekly - 2 sets - 10 reps - 2 hold - Supine Posterior Pelvic Tilt  - 1 x daily - 7 x weekly - 2 sets - 10 reps - 2 hold - Supine Single Knee to Chest Stretch  - 1 x daily - 7 x weekly - 3 sets - 10 reps   ASSESSMENT:  CLINICAL IMPRESSION: Patient referred to PT for L hip/ LBP. He demonstrates functional ROM and strength. Pt with a trendelenburg gait pattern with increased walking resulting in increased pain with prolonged duration. Patient will benefit from skilled PT to address below impairments, limitations and improve overall function.  OBJECTIVE IMPAIRMENTS: decreased activity tolerance, difficulty walking, decreased balance, decreased endurance, decreased mobility, decreased ROM, decreased strength, impaired flexibility, impaired UE/LE use, postural dysfunction, and pain.  ACTIVITY LIMITATIONS: bending, lifting, carry, locomotion, cleaning, community activity, driving, and or occupation  PERSONAL FACTORS: HTN are also affecting patient's functional outcome.  REHAB POTENTIAL: Good  CLINICAL DECISION MAKING: Stable/uncomplicated  EVALUATION COMPLEXITY: Low    GOALS: Short term PT Goals Target date: 10/23/2022 Pt will be I and compliant with HEP. Baseline:  Goal status: New Pt will decrease pain by 25% overall Baseline: Goal status: New  Long term PT goals Target date: 11/20/2022 Pt will improve lumbar extension ROM to [facility] without repotted pain to improve functional  mobility Baseline: Goal status: New Pt will improve hip abductor strength to at least 5-/5 MMT to improve functional strength Baseline: Goal status: New Pt will improve FOTO to at least 73% functional to show improved function Baseline: Goal status: New Pt will reduce pain by overall 50% overall with walking his dog.  Baseline: Goal status: New  PLAN: PT FREQUENCY: 1-2 times per week   PT DURATION: 6-8 weeks  PLANNED INTERVENTIONS (unless contraindicated): aquatic PT, Canalith repositioning, cryotherapy, Electrical stimulation, Iontophoresis with 4 mg/ml dexamethasome, Moist heat, traction, Ultrasound, gait training, Therapeutic exercise, balance training, neuromuscular re-education, patient/family education, prosthetic training, manual techniques, passive ROM, dry needling, taping, vasopnuematic device, vestibular, spinal manipulations, joint manipulations  PLAN FOR NEXT SESSION: Assess HEP/update PRN, continue to progress functional mobility, strengthen proximal hip muscles. Decrease patients pain and help minimize functional deficits.     Lynden Ang, PT 10/09/2022, 9:41 AM

## 2022-10-09 ENCOUNTER — Ambulatory Visit: Payer: Medicare Other | Attending: Family Medicine | Admitting: Physical Therapy

## 2022-10-09 ENCOUNTER — Encounter: Payer: Self-pay | Admitting: Physical Therapy

## 2022-10-09 ENCOUNTER — Other Ambulatory Visit: Payer: Self-pay

## 2022-10-09 DIAGNOSIS — M6281 Muscle weakness (generalized): Secondary | ICD-10-CM | POA: Insufficient documentation

## 2022-10-09 DIAGNOSIS — R2689 Other abnormalities of gait and mobility: Secondary | ICD-10-CM | POA: Insufficient documentation

## 2022-10-09 DIAGNOSIS — M5459 Other low back pain: Secondary | ICD-10-CM | POA: Insufficient documentation

## 2022-10-15 NOTE — Therapy (Unsigned)
OUTPATIENT PHYSICAL THERAPY THORACOLUMBAR TREATMENT    Patient Name: Jonathan Knight MRN: [id number] DOB:May 24, 1946, 77 y.o., male Today's Date: 10/16/2022  END OF SESSION:  PT End of Session - 10/16/22 1153     Visit Number 2    Number of Visits 16    Date for PT Re-Evaluation 11/20/22    Authorization Type [employer]    Progress Note Due on Visit 10    PT Start Time 1100    PT Stop Time 1142    PT Time Calculation (min) 42 min    Activity Tolerance Patient tolerated treatment well    Behavior During Therapy WFL for tasks assessed/performed              History reviewed. No pertinent past medical history. History reviewed. No pertinent surgical history. There are no problems to display for this patient.  REFERRING PROVIDER: Saintclair Halsted, FNP  REFERRING DIAG: Acute right-sided low back pain without sciatica [M54.50]   Rationale for Evaluation and Treatment: Rehabilitation  THERAPY DIAG:  Other low back pain  Other abnormalities of gait and mobility  Muscle weakness (generalized)  ONSET DATE: 3-4 weeks ago  SUBJECTIVE:                                                                                                                                                                                           SUBJECTIVE STATEMENT: I have overall been feeling better. I continue to have some intermittent sharp stabbing pain's when walking my dog 3- 4 blocks.   Eval: Pt reports an insidious onset of L hip/ LBP that started about 4 weeks ago. He noted the pain when walking 1.5 miles with his dog. Pt notes relief with rest. He takes tylenol intermittently for pain. He is an avid golfer, but does not note pain with those movements.   PERTINENT HISTORY:  HTN  PAIN:  Are you having pain? Yes: NPRS scale: 4-5/10 Pain location: L hip/ LBP  Pain description: Grabbing pain, stabbing pain Aggravating factors: Walking, sleeping.   Relieving factors: Rest,  tylenol   PRECAUTIONS: None  WEIGHT BEARING RESTRICTIONS: No  FALLS:  Has patient fallen in last 6 months? No  LIVING ENVIRONMENT: Lives with: lives with their spouse Lives in: House/apartment Stairs: Yes: Internal: 12 steps; on right going up Has following equipment at home: None  OCCUPATION: Retired   PLOF: Independent  PATIENT GOALS: Pt would like to get back to walking his dog without pain.   OBJECTIVE:   DIAGNOSTIC FINDINGS:  IMPRESSION: 1. No acute findings. 2. 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  PATIENT  SURVEYS:  FOTO 54.93%, 73% in 9 visits.   SCREENING FOR RED FLAGS: Bowel or bladder incontinence: No  COGNITION: Overall cognitive status: Within functional limits for tasks assessed     SENSATION: WFL  POSTURE: No Significant postural limitations  PALPATION: No tenderness to palpation.   LUMBAR ROM:   AROM eval  Flexion 75% Pt able to touch his toes, but has tightness in bilat HS.  Extension 75% with mild 1/10 pain  Right lateral flexion WFL  Left lateral flexion WFL  Right rotation WFL  Left rotation WFL   (Blank rows = not tested)  LOWER EXTREMITY ROM:     Active  Right eval Left eval  Hip flexion [facility]  Hip internal rotation 80% 90%   Hip external rotation [facility]  Knee flexion [facility] WFL  Knee extension WFL WFL   (Blank rows = not tested)  LOWER EXTREMITY MMT:    MMT Right eval Left eval  Hip flexion 5/5 5/5  Knee flexion 5/5 5/5  Knee extension 5/5 5/5  Hip Abduction  4+/5 4/5   (Blank rows = not tested)  FUNCTIONAL TESTS:  6 minute walk test: 1267f  - 2 min onset of pain, constant pain at 3 min. Pt reports wanting to stop at 5 min if he was walking his dog.   GAIT: Distance walked: 13030f Assistive device utilized: None Level of assistance: Complete Independence Comments: mild trendelenburg   TODAY'S TREATMENT:      Date: 10/16/2022: Nustep lvl 3, 5 min LTR x30  Sideling clams with cues for proper  form PT manual hip distraction STM to to lower back and L hip.  Seated clams with yellow loop x20  Seated marches with yellow loop x20  Leg press x70#, bilat x20                                                                                                                           DATE: Creating, reviewing, and completing below HEP    PATIENT EDUCATION:  Education details: Educated pt on anatomy and physiology of current symptoms, FOTO, diagnosis, prognosis, HEP,  and POC. Person educated: Patient Education method: ExCustomer service managerducation comprehension: verbalized understanding and returned demonstration  HOME EXERCISE PROGRAM: Access Code: [email or web address] URL: [email or web address] Date: 10/09/2022 Prepared by: SiRudi HeapExercises - Supine Lower Trunk Rotation  - 1 x daily - 7 x weekly - 2 sets - 10 reps - 2 hold - Supine Posterior Pelvic Tilt  - 1 x daily - 7 x weekly - 2 sets - 10 reps - 2 hold - Supine Single Knee to Chest Stretch  - 1 x daily - 7 x weekly - 3 sets - 10 reps   ASSESSMENT:  CLINICAL IMPRESSION: Patient presents to first f/u appt with overall improved pain. Further assessment and education of hip with possible bursitis. Challenged pt with supine and seated exercises with focus on glute med and proximal hip. Challenged him  with leg press. Pt reports no pain today. Plan to challenge pt with standing exercises next session. Pt will continue to benefit from skilled PT to address continued deficits.   OBJECTIVE IMPAIRMENTS: decreased activity tolerance, difficulty walking, decreased balance, decreased endurance, decreased mobility, decreased ROM, decreased strength, impaired flexibility, impaired UE/LE use, postural dysfunction, and pain.  ACTIVITY LIMITATIONS: bending, lifting, carry, locomotion, cleaning, community activity, driving, and or occupation  PERSONAL FACTORS: HTN are also affecting patient's functional outcome.  REHAB  POTENTIAL: Good  CLINICAL DECISION MAKING: Stable/uncomplicated  EVALUATION COMPLEXITY: Low    GOALS: Short term PT Goals Target date: 10/23/2022 Pt will be I and compliant with HEP. Baseline:  Goal status: New Pt will decrease pain by 25% overall Baseline: Goal status: New  Long term PT goals Target date: 11/20/2022 Pt will improve lumbar extension ROM to [facility] without repotted pain to improve functional mobility Baseline: Goal status: New Pt will improve hip abductor strength to at least 5-/5 MMT to improve functional strength Baseline: Goal status: New Pt will improve FOTO to at least 73% functional to show improved function Baseline: Goal status: New Pt will reduce pain by overall 50% overall with walking his dog.  Baseline: Goal status: New  PLAN: PT FREQUENCY: 1-2 times per week   PT DURATION: 6-8 weeks  PLANNED INTERVENTIONS (unless contraindicated): aquatic PT, Canalith repositioning, cryotherapy, Electrical stimulation, Iontophoresis with 4 mg/ml dexamethasome, Moist heat, traction, Ultrasound, gait training, Therapeutic exercise, balance training, neuromuscular re-education, patient/family education, prosthetic training, manual techniques, passive ROM, dry needling, taping, vasopnuematic device, vestibular, spinal manipulations, joint manipulations  PLAN FOR NEXT SESSION: Assess HEP/update PRN, continue to progress functional mobility, strengthen proximal hip muscles. Decrease patients pain and help minimize functional deficits.     Lynden Ang, PT 10/16/2022, 12:10 PM

## 2022-10-16 ENCOUNTER — Encounter: Payer: Self-pay | Admitting: Physical Therapy

## 2022-10-16 ENCOUNTER — Ambulatory Visit: Payer: Medicare Other | Admitting: Physical Therapy

## 2022-10-16 DIAGNOSIS — R2689 Other abnormalities of gait and mobility: Secondary | ICD-10-CM | POA: Diagnosis not present

## 2022-10-16 DIAGNOSIS — M5459 Other low back pain: Secondary | ICD-10-CM

## 2022-10-16 DIAGNOSIS — M6281 Muscle weakness (generalized): Secondary | ICD-10-CM | POA: Diagnosis not present

## 2022-10-16 NOTE — Therapy (Unsigned)
OUTPATIENT PHYSICAL THERAPY THORACOLUMBAR TREATMENT    Patient Name: Jonathan Knight MRN: [id number] DOB:01/19/1946, 77 y.o., male Today's Date: 10/18/2022  END OF SESSION:  PT End of Session - 10/18/22 1108     Visit Number 3    Number of Visits 16    Date for PT Re-Evaluation 11/20/22    Authorization Type [employer]    Progress Note Due on Visit 10    PT Start Time 1105    PT Stop Time 1145    PT Time Calculation (min) 40 min    Activity Tolerance Patient tolerated treatment well    Behavior During Therapy [facility] for tasks assessed/performed               History reviewed. No pertinent past medical history. History reviewed. No pertinent surgical history. There are no problems to display for this patient.  REFERRING PROVIDER: Saintclair Halsted, FNP  REFERRING DIAG: Acute right-sided low back pain without sciatica [M54.50]   Rationale for Evaluation and Treatment: Rehabilitation  THERAPY DIAG:  Other low back pain  Other abnormalities of gait and mobility  Muscle weakness (generalized)  ONSET DATE: 3-4 weeks ago  SUBJECTIVE:                                                                                                                                                                                           SUBJECTIVE STATEMENT: I am now able to walk a little more than when I started.   Eval: Pt reports an insidious onset of L hip/ LBP that started about 4 weeks ago. He noted the pain when walking 1.5 miles with his dog. Pt notes relief with rest. He takes tylenol intermittently for pain. He is an avid golfer, but does not note pain with those movements.   PERTINENT HISTORY:  HTN  PAIN:  Are you having pain? Yes: NPRS scale: 4-5/10 Pain location: L hip/ LBP  Pain description: Grabbing pain, stabbing pain Aggravating factors: Walking, sleeping.   Relieving factors: Rest, tylenol   PRECAUTIONS: None  WEIGHT BEARING RESTRICTIONS:  No  FALLS:  Has patient fallen in last 6 months? No  LIVING ENVIRONMENT: Lives with: lives with their spouse Lives in: House/apartment Stairs: Yes: Internal: 12 steps; on right going up Has following equipment at home: None  OCCUPATION: Retired   PLOF: Independent  PATIENT GOALS: Pt would like to get back to walking his dog without pain.   OBJECTIVE:   DIAGNOSTIC FINDINGS:  IMPRESSION: 1. No acute findings. 2. 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  PATIENT SURVEYS:  FOTO 54.93%, 73% in 9 visits.  SCREENING FOR RED FLAGS: Bowel or bladder incontinence: No  COGNITION: Overall cognitive status: Within functional limits for tasks assessed     SENSATION: WFL  POSTURE: No Significant postural limitations  PALPATION: No tenderness to palpation.   LUMBAR ROM:   AROM eval  Flexion 75% Pt able to touch his toes, but has tightness in bilat HS.  Extension 75% with mild 1/10 pain  Right lateral flexion WFL  Left lateral flexion WFL  Right rotation WFL  Left rotation WFL   (Blank rows = not tested)  LOWER EXTREMITY ROM:     Active  Right eval Left eval  Hip flexion [facility]  Hip internal rotation 80% 90%   Hip external rotation [facility]  Knee flexion [facility] WFL  Knee extension WFL WFL   (Blank rows = not tested)  LOWER EXTREMITY MMT:    MMT Right eval Left eval  Hip flexion 5/5 5/5  Knee flexion 5/5 5/5  Knee extension 5/5 5/5  Hip Abduction  4+/5 4/5   (Blank rows = not tested)  FUNCTIONAL TESTS:  6 minute walk test: 1213f  - 2 min onset of pain, constant pain at 3 min. Pt reports wanting to stop at 5 min if he was walking his dog.   GAIT: Distance walked: 13067f Assistive device utilized: None Level of assistance: Complete Independence Comments: mild trendelenburg   TODAY'S TREATMENT:     Date: 10/18/2022: Recumbent bike 5 min, lvl 3. PT present for subjective. Side stepping with green TB, 6 laps at bar  Standing hip flexion x15, bilat   Standing hip extension x15, bilat  Standing hip abduction x15, bilat  Sit to stand from chair with green TB, x20  Step up 6" step fwd x15 each, lateral x15 each Seated HS stretch 2x1 min each side Active walking 4 laps around clinic.  Date: 10/16/2022: Nustep lvl 3, 5 min LTR x30  Sideling clams with cues for proper form PT manual hip distraction STM to to lower back and L hip.  Seated clams with yellow loop x20  Seated marches with yellow loop x20  Leg press x70#, bilat x20                                                                                                                           DATE: Creating, reviewing, and completing below HEP    PATIENT EDUCATION:  Education details: Educated pt on anatomy and physiology of current symptoms, FOTO, diagnosis, prognosis, HEP,  and POC. Person educated: Patient Education method: ExCustomer service managerducation comprehension: verbalized understanding and returned demonstration  HOME EXERCISE PROGRAM: Access Code: [email or web address] URL: [email or web address] Date: 10/09/2022 Prepared by: SiRudi HeapExercises - Supine Lower Trunk Rotation  - 1 x daily - 7 x weekly - 2 sets - 10 reps - 2 hold - Supine Posterior Pelvic Tilt  - 1 x daily - 7 x weekly - 2 sets - 10 reps - 2 hold -  Supine Single Knee to Chest Stretch  - 1 x daily - 7 x weekly - 3 sets - 10 reps   ASSESSMENT:  CLINICAL IMPRESSION: Patient continues to report improvements with walking and minimal pain between 1-3/10. Session with focus proximal hip and lower trunk strengthening today. Pt reports intermittent "grabbing" in his R lower back/ hip with weight shifting to R LE. He was able to progress with standing exercises today with no reports of overall pain. He will continue to benefit from glute med and proximal hip strengthening. Discussed joining silver sneakers to continue strengthening outside of clinic. He was able to walk further today without  noted pain, but continued to demonstrate a hip from on the R side. Pt will continue to benefit from skilled PT to address continued deficits.   OBJECTIVE IMPAIRMENTS: decreased activity tolerance, difficulty walking, decreased balance, decreased endurance, decreased mobility, decreased ROM, decreased strength, impaired flexibility, impaired UE/LE use, postural dysfunction, and pain.  ACTIVITY LIMITATIONS: bending, lifting, carry, locomotion, cleaning, community activity, driving, and or occupation  PERSONAL FACTORS: HTN are also affecting patient's functional outcome.  REHAB POTENTIAL: Good  CLINICAL DECISION MAKING: Stable/uncomplicated  EVALUATION COMPLEXITY: Low    GOALS: Short term PT Goals Target date: 10/23/2022 Pt will be I and compliant with HEP. Baseline:  Goal status: New Pt will decrease pain by 25% overall Baseline: Goal status: New  Long term PT goals Target date: 11/20/2022 Pt will improve lumbar extension ROM to [facility] without repotted pain to improve functional mobility Baseline: Goal status: New Pt will improve hip abductor strength to at least 5-/5 MMT to improve functional strength Baseline: Goal status: New Pt will improve FOTO to at least 73% functional to show improved function Baseline: Goal status: New Pt will reduce pain by overall 50% overall with walking his dog.  Baseline: Goal status: New  PLAN: PT FREQUENCY: 1-2 times per week   PT DURATION: 6-8 weeks  PLANNED INTERVENTIONS (unless contraindicated): aquatic PT, Canalith repositioning, cryotherapy, Electrical stimulation, Iontophoresis with 4 mg/ml dexamethasome, Moist heat, traction, Ultrasound, gait training, Therapeutic exercise, balance training, neuromuscular re-education, patient/family education, prosthetic training, manual techniques, passive ROM, dry needling, taping, vasopnuematic device, vestibular, spinal manipulations, joint manipulations  PLAN FOR NEXT SESSION: Assess HEP/update PRN,  continue to progress functional mobility, strengthen proximal hip muscles. Decrease patients pain and help minimize functional deficits.     Lynden Ang, PT 10/18/2022, 12:23 PM

## 2022-10-18 ENCOUNTER — Ambulatory Visit: Payer: Medicare Other | Admitting: Physical Therapy

## 2022-10-18 ENCOUNTER — Encounter: Payer: Self-pay | Admitting: Physical Therapy

## 2022-10-18 DIAGNOSIS — M5459 Other low back pain: Secondary | ICD-10-CM

## 2022-10-18 DIAGNOSIS — R2689 Other abnormalities of gait and mobility: Secondary | ICD-10-CM

## 2022-10-18 DIAGNOSIS — M6281 Muscle weakness (generalized): Secondary | ICD-10-CM

## 2022-10-24 ENCOUNTER — Ambulatory Visit: Payer: Medicare Other | Admitting: Physical Therapy

## 2022-10-24 DIAGNOSIS — M6281 Muscle weakness (generalized): Secondary | ICD-10-CM | POA: Diagnosis not present

## 2022-10-24 DIAGNOSIS — M5459 Other low back pain: Secondary | ICD-10-CM | POA: Diagnosis not present

## 2022-10-24 DIAGNOSIS — R2689 Other abnormalities of gait and mobility: Secondary | ICD-10-CM | POA: Diagnosis not present

## 2022-10-24 NOTE — Patient Instructions (Signed)

## 2022-10-24 NOTE — Therapy (Signed)
OUTPATIENT PHYSICAL THERAPY THORACOLUMBAR TREATMENT    Patient Name: Jonathan Knight MRN: [id number] DOB:08-Feb-1946, 77 y.o., male Today's Date: 10/24/2022  END OF SESSION:  PT End of Session - 10/24/22 0756     Visit Number 4    Number of Visits 16    Date for PT Re-Evaluation 11/20/22    Authorization Type [employer]    Progress Note Due on Visit 10    PT Start Time 0758    PT Stop Time [phone]    PT Time Calculation (min) 40 min    Activity Tolerance Patient tolerated treatment well               No past medical history on file. No past surgical history on file. There are no problems to display for this patient.  REFERRING PROVIDER: Saintclair Halsted, FNP  REFERRING DIAG: Acute right-sided low back pain without sciatica [M54.50]   Rationale for Evaluation and Treatment: Rehabilitation  THERAPY DIAG:  Other low back pain  Other abnormalities of gait and mobility  Muscle weakness (generalized)  ONSET DATE: 3-4 weeks ago  SUBJECTIVE:                                                                                                                                                                                           SUBJECTIVE STATEMENT: Sunday morning had sharp right low back pain.  [place]. Played golf Monday and was fine.  Yesterday increased pain. Walked the dog 2 blocks this morning no pain.  Doing ex's 2x/day  Eval: Pt reports an insidious onset of L hip/ LBP that started about 4 weeks ago. He noted the pain when walking 1.5 miles with his dog. Pt notes relief with rest. He takes tylenol intermittently for pain. He is an avid golfer, but does not note pain with those movements.   PERTINENT HISTORY:  HTN  PAIN:  Are you having pain? Yes: NPRS scale: 0/10 Pain location: L hip/ LBP  Pain description: Grabbing pain, stabbing pain Aggravating factors: Walking, sleeping.   Relieving factors: Rest, tylenol   PRECAUTIONS: None  WEIGHT  BEARING RESTRICTIONS: No  FALLS:  Has patient fallen in last 6 months? No  LIVING ENVIRONMENT: Lives with: lives with their spouse Lives in: House/apartment Stairs: Yes: Internal: 12 steps; on right going up Has following equipment at home: None  OCCUPATION: Retired   PLOF: Independent  PATIENT GOALS: Pt would like to get back to walking his dog without pain.   OBJECTIVE:   DIAGNOSTIC FINDINGS:  IMPRESSION: 1. No acute findings. 2. 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  PATIENT SURVEYS:  FOTO 54.93%, 73% in 9 visits.   SCREENING FOR RED FLAGS: Bowel or bladder incontinence: No  COGNITION: Overall cognitive status: Within functional limits for tasks assessed     SENSATION: WFL  POSTURE: No Significant postural limitations  PALPATION: No tenderness to palpation.   LUMBAR ROM:   AROM eval  Flexion 75% Pt able to touch his toes, but has tightness in bilat HS.  Extension 75% with mild 1/10 pain  Right lateral flexion WFL  Left lateral flexion WFL  Right rotation WFL  Left rotation WFL   (Blank rows = not tested)  LOWER EXTREMITY ROM:     Active  Right eval Left eval  Hip flexion [facility]  Hip internal rotation 80% 90%   Hip external rotation [facility]  Knee flexion [facility] WFL  Knee extension WFL WFL   (Blank rows = not tested)  LOWER EXTREMITY MMT:    MMT Right eval Left eval  Hip flexion 5/5 5/5  Knee flexion 5/5 5/5  Knee extension 5/5 5/5  Hip Abduction  4+/5 4/5   (Blank rows = not tested)  FUNCTIONAL TESTS:  6 minute walk test: 121f  - 2 min onset of pain, constant pain at 3 min. Pt reports wanting to stop at 5 min if he was walking his dog.   GAIT: Distance walked: 13027f Assistive device utilized: None Level of assistance: Complete Independence Comments: mild trendelenburg   TODAY'S TREATMENT:     Date: 10/24/2022: Recumbent bike 5 min, lvl 3. PT present for subjective. 2nd step hip flexor/QL stretch with UE overhead reach 10x  right/left Review of HEP and frequency of performance Quadruped rock  Manual therapy: lumbar neutral gapping 3x 30 sec in sidelying; pelvic distraction in sidelying; right hip long axis distraction grade 3 3x 30 sec; inferior mobs grade 3 3x30 sec; soft tissue mobilization to gluteals (upper) Trigger Point Dry-Needling  Treatment instructions: Expect mild to moderate muscle soreness. S/S of pneumothorax if dry needled over a lung field, and to seek immediate medical attention should they occur. Patient verbalized understanding of these instructions and education.  Patient Consent Given: Yes Education handout provided: Yes Muscles treated: left gluteals  Electrical stimulation performed: No Parameters: N/A Treatment response/outcome: improved soft tissue mobility Moist heat 3 min     Date: 10/18/2022: Recumbent bike 5 min, lvl 3. PT present for subjective. Side stepping with green TB, 6 laps at bar  Standing hip flexion x15, bilat  Standing hip extension x15, bilat  Standing hip abduction x15, bilat  Sit to stand from chair with green TB, x20  Step up 6" step fwd x15 each, lateral x15 each Seated HS stretch 2x1 min each side Active walking 4 laps around clinic.  Date: 10/16/2022: Nustep lvl 3, 5 min LTR x30  Sideling clams with cues for proper form PT manual hip distraction STM to to lower back and L hip.  Seated clams with yellow loop x20  Seated marches with yellow loop x20  Leg press x70#, bilat x20  DATE: Creating, reviewing, and completing below HEP    PATIENT EDUCATION:  Education details: Educated pt on anatomy and physiology of current symptoms, FOTO, diagnosis, prognosis, HEP,  and POC. Person educated: Patient Education method: Customer service manager Education comprehension: verbalized understanding and returned demonstration  HOME  EXERCISE PROGRAM: Access Code: [email or web address] URL: [email or web address] Date: 10/24/2022 Prepared by: Ruben Im  Exercises - Supine Lower Trunk Rotation  - 1 x daily - 7 x weekly - 2 sets - 10 reps - 2 hold - Supine Posterior Pelvic Tilt  - 1 x daily - 7 x weekly - 2 sets - 10 reps - 2 hold - Supine Single Knee to Chest Stretch  - 1 x daily - 7 x weekly - 3 sets - 10 reps - Standing Hip Abduction with Counter Support  - 2 x daily - 7 x weekly - 2 sets - 10 reps - Standing Hip Extension with Counter Support  - 2 x daily - 7 x weekly - 2 sets - 10 reps - Standing Hip Flexion with Counter Support  - 2 x daily - 7 x weekly - 2 sets - 10 reps - Side Stepping with Resistance at Ankles and Counter Support  - 2 x daily - 7 x weekly - 2 sets - 10 reps - Sit to Stand Without Arm Support  - 2 x daily - 7 x weekly - 2 sets - 10 reps - Seated Hamstring Stretch  - 2 x daily - 7 x weekly - 2 sets - 2 reps - 30 hold - Hip Flexor Stretch on Step  - 1 x daily - 7 x weekly - 1 sets - 10 reps - Quadruped Rocking Backward  - 1 x daily - 7 x weekly - 1 sets - 10 reps    ASSESSMENT:  CLINICAL IMPRESSION:  The patient is agreeable to DN to address ongoing pain in upper posterior hip region.  He has a good initial response with improved lumbo/pelvic/hip mobility following.  The patient was encouraged in regular performance of HEP post DN including soft tissue lengthening and strengthening exercises to enhance long term benefit. Therapist monitoring response to all interventions and modifying treatment accordingly.      OBJECTIVE IMPAIRMENTS: decreased activity tolerance, difficulty walking, decreased balance, decreased endurance, decreased mobility, decreased ROM, decreased strength, impaired flexibility, impaired UE/LE use, postural dysfunction, and pain.  ACTIVITY LIMITATIONS: bending, lifting, carry, locomotion, cleaning, community activity, driving, and or occupation  PERSONAL FACTORS: HTN  are also affecting patient's functional outcome.  REHAB POTENTIAL: Good  CLINICAL DECISION MAKING: Stable/uncomplicated  EVALUATION COMPLEXITY: Low    GOALS: Short term PT Goals Target date: 10/23/2022 Pt will be I and compliant with HEP. Baseline:  Goal status: met 2/28 Pt will decrease pain by 25% overall Baseline: Goal status: New  Long term PT goals Target date: 11/20/2022 Pt will improve lumbar extension ROM to [facility] without repotted pain to improve functional mobility Baseline: Goal status: New Pt will improve hip abductor strength to at least 5-/5 MMT to improve functional strength Baseline: Goal status: New Pt will improve FOTO to at least 73% functional to show improved function Baseline: Goal status: New Pt will reduce pain by overall 50% overall with walking his dog.  Baseline: Goal status: New  PLAN: PT FREQUENCY: 1-2 times per week   PT DURATION: 6-8 weeks  PLANNED INTERVENTIONS (unless contraindicated): aquatic PT, Canalith repositioning, cryotherapy, Electrical stimulation, Iontophoresis with 4 mg/ml dexamethasome, Moist heat, traction, Ultrasound, gait training, Therapeutic exercise,  balance training, neuromuscular re-education, patient/family education, prosthetic training, manual techniques, passive ROM, dry needling, taping, vasopnuematic device, vestibular, spinal manipulations, joint manipulations  PLAN FOR NEXT SESSION: assess response to DN (may add QL) and hip mobilizations;  review quadruped rocking;  Assess HEP/update PRN, continue to progress functional mobility, strengthen proximal hip muscles. Decrease patients pain and help minimize functional deficits.    Access Code: [email or web address] URL: [email or web address] Date: 10/24/2022 Prepared by: Ruben Im  Exercises - Supine Lower Trunk Rotation  - 1 x daily - 7 x weekly - 2 sets - 10 reps - 2 hold - Supine Posterior Pelvic Tilt  - 1 x daily - 7 x weekly - 2 sets - 10 reps - 2 hold -  Supine Single Knee to Chest Stretch  - 1 x daily - 7 x weekly - 3 sets - 10 reps - Standing Hip Abduction with Counter Support  - 2 x daily - 7 x weekly - 2 sets - 10 reps - Standing Hip Extension with Counter Support  - 2 x daily - 7 x weekly - 2 sets - 10 reps - Standing Hip Flexion with Counter Support  - 2 x daily - 7 x weekly - 2 sets - 10 reps - Side Stepping with Resistance at Ankles and Counter Support  - 2 x daily - 7 x weekly - 2 sets - 10 reps - Sit to Stand Without Arm Support  - 2 x daily - 7 x weekly - 2 sets - 10 reps - Seated Hamstring Stretch  - 2 x daily - 7 x weekly - 2 sets - 2 reps - 30 hold - Hip Flexor Stretch on Step  - 1 x daily - 7 x weekly - 1 sets - 10 reps - Quadruped Rocking Backward  - 1 x daily - 7 x weekly - 1 sets - 10 reps   Ruben Im, PT 10/24/22 5:02 PM Phone: [phone] Fax: [phone]

## 2022-10-26 ENCOUNTER — Ambulatory Visit: Payer: Medicare Other | Attending: Family Medicine | Admitting: Physical Therapy

## 2022-10-26 DIAGNOSIS — R2689 Other abnormalities of gait and mobility: Secondary | ICD-10-CM

## 2022-10-26 DIAGNOSIS — M6281 Muscle weakness (generalized): Secondary | ICD-10-CM | POA: Diagnosis not present

## 2022-10-26 DIAGNOSIS — M5459 Other low back pain: Secondary | ICD-10-CM | POA: Insufficient documentation

## 2022-10-26 NOTE — Therapy (Signed)
OUTPATIENT PHYSICAL THERAPY THORACOLUMBAR TREATMENT    Patient Name: Jonathan Knight MRN: [id number] DOB:Feb 03, 1946, 77 y.o., male Today's Date: 10/26/2022  END OF SESSION:  PT End of Session - 10/26/22 0850     Visit Number 5    Number of Visits 16    Date for PT Re-Evaluation 11/20/22    Authorization Type [employer]    Progress Note Due on Visit 10    PT Start Time 0845    PT Stop Time 0927    PT Time Calculation (min) 42 min    Activity Tolerance Patient tolerated treatment well               No past medical history on file. No past surgical history on file. There are no problems to display for this patient.  REFERRING PROVIDER: Saintclair Halsted, FNP  REFERRING DIAG: Acute right-sided low back pain without sciatica [M54.50]   Rationale for Evaluation and Treatment: Rehabilitation  THERAPY DIAG:  Other low back pain  Other abnormalities of gait and mobility  Muscle weakness (generalized)  ONSET DATE: 3-4 weeks ago  SUBJECTIVE:                                                                                                                                                                                           SUBJECTIVE STATEMENT: Still had some sharp pains on Wednesday and Thursday with walking, it grabs and I have to stop.  Will play golf on Monday.    Eval: Pt reports an insidious onset of L hip/ LBP that started about 4 weeks ago. He noted the pain when walking 1.5 miles with his dog. Pt notes relief with rest. He takes tylenol intermittently for pain. He is an avid golfer, but does not note pain with those movements.   PERTINENT HISTORY:  HTN  PAIN:  Are you having pain? Yes: NPRS scale: 0/10 Pain location: L hip/ LBP  Pain description: Grabbing pain, stabbing pain Aggravating factors: Walking, sleeping.   Relieving factors: Rest, tylenol   PRECAUTIONS: None  WEIGHT BEARING RESTRICTIONS: No  FALLS:  Has patient fallen in  last 6 months? No  LIVING ENVIRONMENT: Lives with: lives with their spouse Lives in: House/apartment Stairs: Yes: Internal: 12 steps; on right going up Has following equipment at home: None  OCCUPATION: Retired   PLOF: Independent  PATIENT GOALS: Pt would like to get back to walking his dog without pain.   OBJECTIVE:   DIAGNOSTIC FINDINGS:  IMPRESSION: 1. No acute findings. 2. 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  PATIENT SURVEYS:  FOTO 54.93%, 73% in 9  visits.   SCREENING FOR RED FLAGS: Bowel or bladder incontinence: No  COGNITION: Overall cognitive status: Within functional limits for tasks assessed     SENSATION: WFL  POSTURE: No Significant postural limitations  PALPATION: No tenderness to palpation.   LUMBAR ROM:   AROM eval  Flexion 75% Pt able to touch his toes, but has tightness in bilat HS.  Extension 75% with mild 1/10 pain  Right lateral flexion WFL  Left lateral flexion WFL  Right rotation WFL  Left rotation WFL   (Blank rows = not tested)  LOWER EXTREMITY ROM:     Active  Right eval Left eval  Hip flexion [facility]  Hip internal rotation 80% 90%   Hip external rotation [facility]  Knee flexion [facility] WFL  Knee extension WFL WFL   (Blank rows = not tested)  LOWER EXTREMITY MMT:    MMT Right eval Left eval  Hip flexion 5/5 5/5  Knee flexion 5/5 5/5  Knee extension 5/5 5/5  Hip Abduction  4+/5 4/5   (Blank rows = not tested)  FUNCTIONAL TESTS:  6 minute walk test: 1250f  - 2 min onset of pain, constant pain at 3 min. Pt reports wanting to stop at 5 min if he was walking his dog.   GAIT: Distance walked: 13050f Assistive device utilized: None Level of assistance: Complete Independence Comments: mild trendelenburg   TODAY'S TREATMENT:      Date: 10/26/2022: Nu-Step 8 min, lvl 3., old PT present for subjective. Quadruped rock with biased directions and crossed ankles Seated blue ball roll out 3 ways Seated with peanut ball  trunk elongation with reach up and over 8x right/left Supine with blue ball under heels for flexion 10x Supine with ball glute/piriformis stretch ankle crossed over knee 8x right/left Supine lumbar rotation with ball 15x Open books with foam roll 8x right/left Manual therapy: lumbar neutral gapping 3x 30 sec in sidelying; pelvic distraction in sidelying; right hip long axis distraction grade 3 3x 30 sec; inferior mobs grade 3 3x30 sec; piriformis stretch Standing lean on elbows on tall hi-lo table hip extension 10x right/left Discussed mid day 15 decompression since symptoms mostly present in the PM vs AM   Date: 10/24/2022: Recumbent bike 5 min, lvl 3. PT present for subjective. 2nd step hip flexor/QL stretch with UE overhead reach 10x right/left Review of HEP and frequency of performance Quadruped rock  Manual therapy: lumbar neutral gapping 3x 30 sec in sidelying; pelvic distraction in sidelying; right hip long axis distraction grade 3 3x 30 sec; inferior mobs grade 3 3x30 sec; soft tissue mobilization to gluteals (upper) Trigger Point Dry-Needling  Treatment instructions: Expect mild to moderate muscle soreness. S/S of pneumothorax if dry needled over a lung field, and to seek immediate medical attention should they occur. Patient verbalized understanding of these instructions and education.  Patient Consent Given: Yes Education handout provided: Yes Muscles treated: left gluteals  Electrical stimulation performed: No Parameters: N/A Treatment response/outcome: improved soft tissue mobility Moist heat 3 min     Date: 10/18/2022: Recumbent bike 5 min, lvl 3. PT present for subjective. Side stepping with green TB, 6 laps at bar  Standing hip flexion x15, bilat  Standing hip extension x15, bilat  Standing hip abduction x15, bilat  Sit to stand from chair with green TB, x20  Step up 6" step fwd x15 each, lateral x15 each Seated HS stretch 2x1 min each side Active walking 4 laps  around clinic.  DATE: Creating, reviewing, and completing below HEP    PATIENT EDUCATION:  Education details: Educated pt on anatomy and physiology of current symptoms, FOTO, diagnosis, prognosis, HEP,  and POC. Person educated: Patient Education method: Customer service manager Education comprehension: verbalized understanding and returned demonstration  HOME EXERCISE PROGRAM: Access Code: [email or web address] URL: [email or web address] Date: 10/24/2022 Prepared by: Ruben Im  Exercises - Supine Lower Trunk Rotation  - 1 x daily - 7 x weekly - 2 sets - 10 reps - 2 hold - Supine Posterior Pelvic Tilt  - 1 x daily - 7 x weekly - 2 sets - 10 reps - 2 hold - Supine Single Knee to Chest Stretch  - 1 x daily - 7 x weekly - 3 sets - 10 reps - Standing Hip Abduction with Counter Support  - 2 x daily - 7 x weekly - 2 sets - 10 reps - Standing Hip Extension with Counter Support  - 2 x daily - 7 x weekly - 2 sets - 10 reps - Standing Hip Flexion with Counter Support  - 2 x daily - 7 x weekly - 2 sets - 10 reps - Side Stepping with Resistance at Ankles and Counter Support  - 2 x daily - 7 x weekly - 2 sets - 10 reps - Sit to Stand Without Arm Support  - 2 x daily - 7 x weekly - 2 sets - 10 reps - Seated Hamstring Stretch  - 2 x daily - 7 x weekly - 2 sets - 2 reps - 30 hold - Hip Flexor Stretch on Step  - 1 x daily - 7 x weekly - 1 sets - 10 reps - Quadruped Rocking Backward  - 1 x daily - 7 x weekly - 1 sets - 10 reps    ASSESSMENT:  CLINICAL IMPRESSION:  Treatment focus on lengthening gluteals, QL and lumbar fascia and decompression strategies.  Minimal pain only reported intermittently during session.  Patient may benefit from further DN to lumbar musculature.  Therapist monitoring response throughout session and modifying treatment accordingly.    OBJECTIVE  IMPAIRMENTS: decreased activity tolerance, difficulty walking, decreased balance, decreased endurance, decreased mobility, decreased ROM, decreased strength, impaired flexibility, impaired UE/LE use, postural dysfunction, and pain.  ACTIVITY LIMITATIONS: bending, lifting, carry, locomotion, cleaning, community activity, driving, and or occupation  PERSONAL FACTORS: HTN are also affecting patient's functional outcome.  REHAB POTENTIAL: Good  CLINICAL DECISION MAKING: Stable/uncomplicated  EVALUATION COMPLEXITY: Low    GOALS: Short term PT Goals Target date: 10/23/2022 Pt will be I and compliant with HEP. Baseline:  Goal status: met 2/28 Pt will decrease pain by 25% overall Baseline: Goal status: ongoing  Long term PT goals Target date: 11/20/2022 Pt will improve lumbar extension ROM to [facility] without repotted pain to improve functional mobility Baseline: Goal status: New Pt will improve hip abductor strength to at least 5-/5 MMT to improve functional strength Baseline: Goal status: New Pt will improve FOTO to at least 73% functional to show improved function Baseline: Goal status: New Pt will reduce pain by overall 50% overall with walking his dog.  Baseline: Goal status: New  PLAN: PT FREQUENCY: 1-2 times per week   PT DURATION: 6-8 weeks  PLANNED INTERVENTIONS (unless contraindicated): aquatic PT, Canalith repositioning, cryotherapy, Electrical stimulation, Iontophoresis with 4 mg/ml dexamethasome, Moist heat, traction, Ultrasound, gait training, Therapeutic exercise, balance training, neuromuscular re-education, patient/family education, prosthetic training, manual techniques, passive ROM, dry needling, taping, vasopnuematic device, vestibular, spinal manipulations, joint manipulations  PLAN  FOR NEXT SESSION: assess % change for STG; assess response to DN (may add QL) and hip mobilizations;  review quadruped rocking;  Assess HEP/update PRN, continue to progress functional  mobility, strengthen proximal hip muscles. Decrease patients pain and help minimize functional deficits.    Access Code: [email or web address] URL: [email or web address] Date: 10/24/2022 Prepared by: Ruben Im  Exercises - Supine Lower Trunk Rotation  - 1 x daily - 7 x weekly - 2 sets - 10 reps - 2 hold - Supine Posterior Pelvic Tilt  - 1 x daily - 7 x weekly - 2 sets - 10 reps - 2 hold - Supine Single Knee to Chest Stretch  - 1 x daily - 7 x weekly - 3 sets - 10 reps - Standing Hip Abduction with Counter Support  - 2 x daily - 7 x weekly - 2 sets - 10 reps - Standing Hip Extension with Counter Support  - 2 x daily - 7 x weekly - 2 sets - 10 reps - Standing Hip Flexion with Counter Support  - 2 x daily - 7 x weekly - 2 sets - 10 reps - Side Stepping with Resistance at Ankles and Counter Support  - 2 x daily - 7 x weekly - 2 sets - 10 reps - Sit to Stand Without Arm Support  - 2 x daily - 7 x weekly - 2 sets - 10 reps - Seated Hamstring Stretch  - 2 x daily - 7 x weekly - 2 sets - 2 reps - 30 hold - Hip Flexor Stretch on Step  - 1 x daily - 7 x weekly - 1 sets - 10 reps - Quadruped Rocking Backward  - 1 x daily - 7 x weekly - 1 sets - 10 reps   Ruben Im, PT 10/26/22 12:30 PM Phone: [phone] Fax: [phone]

## 2022-10-30 ENCOUNTER — Ambulatory Visit: Payer: Medicare Other | Admitting: Physical Therapy

## 2022-10-30 DIAGNOSIS — M5459 Other low back pain: Secondary | ICD-10-CM

## 2022-10-30 DIAGNOSIS — R2689 Other abnormalities of gait and mobility: Secondary | ICD-10-CM | POA: Diagnosis not present

## 2022-10-30 DIAGNOSIS — M6281 Muscle weakness (generalized): Secondary | ICD-10-CM

## 2022-10-30 NOTE — Therapy (Signed)
OUTPATIENT PHYSICAL THERAPY THORACOLUMBAR TREATMENT    Patient Name: Jonathan Knight MRN: [id number] DOB:1945-12-04, 77 y.o., male Today's Date: 10/30/2022  END OF SESSION:  PT End of Session - 10/30/22 0841     Visit Number 6    Number of Visits 16    Date for PT Re-Evaluation 11/20/22    Authorization Type [employer]    Progress Note Due on Visit 10    PT Start Time 0845    PT Stop Time 0928    PT Time Calculation (min) 43 min    Activity Tolerance Patient tolerated treatment well               No past medical history on file. No past surgical history on file. There are no problems to display for this patient.  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  I had a bad day.  Sunday was about the same.  Monday I played golf but last night was the best it's been.  Walked the dog this morning was OK until the end 40 minutes.  Going to [employer] today.  I may not be able to come on Friday b/c my wife needs another shot.                 Eval: Pt reports an insidious onset of L hip/ LBP that started about 4 weeks ago. He noted the pain when walking 1.5 miles with his dog. Pt notes relief with rest. He takes tylenol intermittently for pain. He is an avid golfer, but does not note pain with those movements.   PERTINENT HISTORY:  HTN  PAIN:  Are you having pain? Yes:  NPRS scale: 0/10 Pain location: L hip/ LBP  Pain description: Grabbing pain, stabbing pain Aggravating factors: Walking, sleeping.   Relieving factors: Rest, tylenol   PRECAUTIONS: None  WEIGHT BEARING RESTRICTIONS: No  FALLS:  Has patient fallen in last 6 months? No  LIVING ENVIRONMENT: Lives with: lives with their spouse Lives  in: House/apartment Stairs: Yes: Internal: 12 steps; on right going up Has following equipment at home: None  OCCUPATION: Retired   PLOF: Independent  PATIENT GOALS: Pt would like to get back to walking his dog without pain.   OBJECTIVE:   DIAGNOSTIC FINDINGS:  IMPRESSION: 1. No acute findings. 2. 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  PATIENT SURVEYS:  FOTO 54.93%, 73% in 9 visits.   SCREENING FOR RED FLAGS: Bowel or bladder incontinence: No  COGNITION: Overall cognitive status: Within functional limits for tasks assessed     SENSATION: WFL  POSTURE: No Significant postural limitations  PALPATION: No tenderness to palpation.   LUMBAR ROM:   AROM eval  Flexion 75% Pt able to touch his toes, but has tightness in bilat HS.  Extension 75% with mild 1/10 pain  Right lateral flexion WFL  Left lateral flexion WFL  Right rotation WFL  Left rotation WFL   (Blank rows = not tested)  LOWER EXTREMITY ROM:     Active  Right eval Left eval  Hip flexion [facility]  Hip internal rotation 80% 90%   Hip external rotation [facility]  Knee flexion [facility] WFL  Knee extension WFL WFL   (Blank rows = not tested)  LOWER EXTREMITY MMT:    MMT Right eval Left eval  Hip flexion 5/5 5/5  Knee flexion 5/5 5/5  Knee extension 5/5 5/5  Hip Abduction  4+/5 4/5   (Blank rows = not tested)  FUNCTIONAL TESTS:  6 minute walk test: 1256f  - 2 min onset of pain, constant pain at 3 min. Pt reports wanting to stop at 5 min if he was walking his dog.   GAIT: Distance walked: 1309f Assistive device utilized: None Level of assistance: Complete  Independence Comments: mild trendelenburg   TODAY'S TREATMENT:     Date: 10/30/2022: Nu-Step 10 min, lvl 4.,  PT present for subjective Standing QL stretch 10x Seated QL stretch off end of table 10x 10# dumbbell by side with marching 5x 10# holding in 1 hand side bend to up straight 5x each side Red band pallof style trunk rotation 10x right/left Manual therapy: soft tissue mobilization to gluteals (upper) Trigger Point Dry-Needling  Treatment instructions: Expect mild to moderate muscle soreness. S/S of pneumothorax if dry needled over a lung field, and to seek immediate medical attention should they occur. Patient verbalized understanding of these instructions and education.  Patient Consent Given: Yes Education handout provided: Yes Muscles treated: left gluteals  Electrical stimulation performed: No Parameters: N/A Treatment response/outcome: improved soft tissue mobility Moist heat 3 min   Date: 10/26/2022: Nu-Step 8 min, lvl 3.,  PT present for subjective. Quadruped rock with biased directions and crossed ankles Seated blue ball roll out 3 ways Seated with peanut ball trunk elongation with reach up and over 8x right/left Supine with blue ball under heels for flexion 10x Supine with ball glute/piriformis stretch ankle crossed over knee 8x right/left Supine lumbar rotation with ball 15x Open books with foam roll 8x right/left Manual therapy: lumbar neutral gapping 3x 30 sec in sidelying; pelvic distraction in sidelying; right hip long axis distraction grade 3 3x 30 sec; inferior mobs grade 3 3x30 sec; piriformis stretch Standing lean on elbows on tall hi-lo table hip extension 10x right/left Discussed mid day 15 decompression since symptoms mostly present in the PM vs AM   Date: 10/24/2022: Recumbent bike 5 min, lvl 3. PT present for subjective. 2nd step hip flexor/QL stretch with UE  overhead reach 10x right/left Review of HEP and frequency of performance Quadruped rock   Manual therapy: lumbar neutral gapping 3x 30 sec in sidelying; pelvic distraction in sidelying; right hip long axis distraction grade 3 3x 30 sec; inferior mobs grade 3 3x30 sec; soft tissue mobilization to gluteals (upper) Trigger Point Dry-Needling  Treatment instructions: Expect mild to moderate muscle soreness. S/S of pneumothorax if dry needled over a lung field, and to seek immediate medical attention should they occur. Patient verbalized understanding of these instructions and education.  Patient Consent Given: Yes Education handout provided: Yes Muscles treated: left gluteals  Electrical stimulation performed: No Parameters: N/A Treatment response/outcome: improved soft tissue mobility Moist heat 3 min    PATIENT EDUCATION:  Education details: Educated pt on anatomy and physiology of current symptoms, FOTO, diagnosis, prognosis, HEP,  and POC. Person educated: Patient Education method: Customer service manager Education comprehension: verbalized understanding and returned demonstration  HOME EXERCISE PROGRAM: Access Code: [email or web address] URL: [email or web address] Date: 10/24/2022 Prepared by: Ruben Im  Exercises - Supine Lower Trunk Rotation  - 1 x daily - 7 x weekly - 2 sets - 10 reps - 2 hold - Supine Posterior Pelvic Tilt  - 1 x daily - 7 x weekly - 2 sets - 10 reps - 2 hold - Supine Single Knee to Chest Stretch  - 1 x daily - 7 x weekly - 3 sets - 10 reps - Standing Hip Abduction with Counter Support  - 2 x daily - 7 x weekly - 2 sets - 10 reps - Standing Hip Extension with Counter Support  - 2 x daily - 7 x weekly - 2 sets - 10 reps - Standing Hip Flexion with Counter Support  - 2 x daily - 7 x weekly - 2 sets - 10 reps - Side Stepping with Resistance at Ankles and Counter Support  - 2 x daily - 7 x weekly - 2 sets - 10 reps - Sit to Stand Without Arm Support  - 2 x daily - 7 x weekly - 2 sets - 10 reps - Seated Hamstring Stretch  - 2 x daily - 7 x  weekly - 2 sets - 2 reps - 30 hold - Hip Flexor Stretch on Step  - 1 x daily - 7 x weekly - 1 sets - 10 reps - Quadruped Rocking Backward  - 1 x daily - 7 x weekly - 1 sets - 10 reps    ASSESSMENT:  CLINICAL IMPRESSION:   Progress is up and down with increased pain after repeated bending, getting on floor for a Saturday project but then did fine playing golf yesterday and walking the dog this morning.  Discussed expected course of recovery including more time to see consistent progress.  He is highly compliant with HEP.  Improved muscle lengthening following DN and manual therapy.  Verbal cues for technique with ex to optimize benefit.  Therapist monitoring response to all interventions and modifying treatment accordingly.     OBJECTIVE IMPAIRMENTS: decreased activity tolerance, difficulty walking, decreased balance, decreased endurance, decreased mobility, decreased ROM, decreased strength, impaired flexibility, impaired UE/LE use, postural dysfunction, and pain.  ACTIVITY LIMITATIONS: bending, lifting, carry, locomotion, cleaning, community activity, driving, and or occupation  PERSONAL FACTORS: HTN are also affecting patient's functional outcome.  REHAB POTENTIAL: Good  CLINICAL DECISION MAKING: Stable/uncomplicated  EVALUATION COMPLEXITY: Low    GOALS: Short term PT Goals Target date: 10/23/2022 Pt will be I and compliant with HEP. Baseline:  Goal status:  met 2/28 Pt will decrease pain by 25% overall Baseline: Goal status: ongoing  Long term PT goals Target date: 11/20/2022 Pt will improve lumbar extension ROM to [facility] without repotted pain to improve functional mobility Baseline: Goal status: New Pt will improve hip abductor strength to at least 5-/5 MMT to improve functional strength Baseline: Goal status: New Pt will improve FOTO to at least 73% functional to show improved function Baseline: Goal status: New Pt will reduce pain by overall 50% overall with walking his  dog.  Baseline: Goal status: New  PLAN: PT FREQUENCY: 1-2 times per week   PT DURATION: 6-8 weeks  PLANNED INTERVENTIONS (unless contraindicated): aquatic PT, Canalith repositioning, cryotherapy, Electrical stimulation, Iontophoresis with 4 mg/ml dexamethasome, Moist heat, traction, Ultrasound, gait training, Therapeutic exercise, balance training, neuromuscular re-education, patient/family education, prosthetic training, manual techniques, passive ROM, dry needling, taping, vasopnuematic device, vestibular, spinal manipulations, joint manipulations  PLAN FOR NEXT SESSION: assess % change for STG; assess response to DN (may add QL) and hip mobilizationsAssess HEP/update PRN, continue to progress functional mobility (kneels at church, gets on floor for community outreach)  instruct in hip hinge; strengthen proximal hip muscles    Ruben Im, PT 10/30/22 9:59 AM Phone: [phone] Fax: [phone]

## 2022-11-01 ENCOUNTER — Ambulatory Visit: Payer: Medicare Other | Admitting: Physical Therapy

## 2022-11-01 DIAGNOSIS — R2689 Other abnormalities of gait and mobility: Secondary | ICD-10-CM

## 2022-11-01 DIAGNOSIS — M5459 Other low back pain: Secondary | ICD-10-CM | POA: Diagnosis not present

## 2022-11-01 DIAGNOSIS — M6281 Muscle weakness (generalized): Secondary | ICD-10-CM

## 2022-11-01 NOTE — Therapy (Signed)
OUTPATIENT PHYSICAL THERAPY THORACOLUMBAR TREATMENT    Patient Name: Jonathan Knight MRN: [id number] DOB:12-06-1945, 77 y.o., male Today's Date: 11/01/2022  END OF SESSION:  PT End of Session - 11/01/22 1055     Visit Number 7    Number of Visits 16    Date for PT Re-Evaluation 11/20/22    Authorization Type [employer]    Progress Note Due on Visit 10    PT Start Time 1013    PT Stop Time 1051    PT Time Calculation (min) 38 min    Activity Tolerance Patient tolerated treatment well               No past medical history on file. No past surgical history on file. There are no problems to display for this patient.  REFERRING PROVIDER: Saintclair Halsted, FNP  REFERRING DIAG: Acute right-sided low back pain without sciatica [M54.50]   Rationale for Evaluation and Treatment: Rehabilitation  THERAPY DIAG:  Other low back pain  Other abnormalities of gait and mobility  Muscle weakness (generalized)  ONSET DATE: 3-4 weeks ago  SUBJECTIVE:                                                                                                                                                                                           SUBJECTIVE STATEMENT:  I walked all around [employer] and walked the dog without pain.  I think we've turned a corner.        Eval: Pt reports an insidious onset of L hip/ LBP that started about 4 weeks ago. He noted the pain when walking 1.5 miles with his dog. Pt notes relief with rest. He takes tylenol intermittently for pain. He is an avid golfer, but does not note pain with those movements.   PERTINENT HISTORY:  HTN  PAIN:  Are you having pain? Yes: NPRS scale: 0/10 Pain location: L hip/ LBP  Pain description: Grabbing pain, stabbing pain Aggravating factors: Walking, sleeping.   Relieving factors: Rest, tylenol   PRECAUTIONS: None  WEIGHT BEARING RESTRICTIONS: No  FALLS:  Has patient fallen in last 6 months? No  LIVING  ENVIRONMENT: Lives with: lives with their spouse Lives in: House/apartment Stairs: Yes: Internal: 12 steps; on right going up Has following equipment at home: None  OCCUPATION: Retired   PLOF: Independent  PATIENT GOALS: Pt would like to get back to walking his dog without pain.   OBJECTIVE:   DIAGNOSTIC FINDINGS:  IMPRESSION: 1. No acute findings. 2. 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  PATIENT SURVEYS:  FOTO 54.93%, 73% in 9 visits.  SCREENING FOR RED FLAGS: Bowel or bladder incontinence: No  COGNITION: Overall cognitive status: Within functional limits for tasks assessed     SENSATION: WFL  POSTURE: No Significant postural limitations  PALPATION: No tenderness to palpation.   LUMBAR ROM:   AROM eval  Flexion 75% Pt able to touch his toes, but has tightness in bilat HS.  Extension 75% with mild 1/10 pain  Right lateral flexion WFL  Left lateral flexion WFL  Right rotation WFL  Left rotation WFL   (Blank rows = not tested)  LOWER EXTREMITY ROM:     Active  Right eval Left eval  Hip flexion [facility]  Hip internal rotation 80% 90%   Hip external rotation [facility]  Knee flexion [facility] WFL  Knee extension WFL WFL   (Blank rows = not tested)  LOWER EXTREMITY MMT:    MMT Right eval Left eval  Hip flexion 5/5 5/5  Knee flexion 5/5 5/5  Knee extension 5/5 5/5  Hip Abduction  4+/5 4/5   (Blank rows = not tested)  FUNCTIONAL TESTS:  6 minute walk test: 126f  - 2 min onset of pain, constant pain at 3 min. Pt reports wanting to stop at 5 min if he was walking his dog.   GAIT: Distance walked: 13052f Assistive device utilized: None Level of assistance: Complete Independence Comments: mild trendelenburg   TODAY'S TREATMENT:    Date: 11/01/2022: Nu-Step 5 min, lvl 3.,  PT present for subjective Open books Supine blue ball piriformis/glute stretch Seated QL stretch off end of table 10x Quadruped alternating hip extension 10x Green band  anchored in door: kickstand position with diagonal UE extension pulls 15x each way Green band anchored in door:  UE pulls with opposite hip flexion 10x to each side Standing on green band: dead lifts 10x      Date: 10/27/29/24Nu-Step 10 min, lvl 4.,  PT present for subjective Standing QL stretch 10x Seated QL stretch off end of table 10x 10# dumbbell by side with marching 5x 10# holding in 1 hand side bend to up straight 5x each side Red band pallof style trunk rotation 10x right/left Manual therapy: soft tissue mobilization to gluteals (upper) Trigger Point Dry-Needling  Treatment instructions: Expect mild to moderate muscle soreness. S/S of pneumothorax if dry needled over a lung field, and to seek immediate medical attention should they occur. Patient verbalized understanding of these instructions and education.  Patient Consent Given: Yes Education handout provided: Yes Muscles treated: left gluteals  Electrical stimulation performed: No Parameters: N/A Treatment response/outcome: improved soft tissue mobility Moist heat 3 min   Date: 10/26/2022: Nu-Step 8 min, lvl 3.,  PT present for subjective. Quadruped rock with biased directions and crossed ankles Seated blue ball roll out 3 ways Seated with peanut ball trunk elongation with reach up and over 8x right/left Supine with blue ball under heels for flexion 10x Supine with ball glute/piriformis stretch ankle crossed over knee 8x right/left Supine lumbar rotation with ball 15x Open books with foam roll 8x right/left Manual therapy: lumbar neutral gapping 3x 30 sec in sidelying; pelvic distraction in sidelying; right hip long axis distraction grade 3 3x 30 sec; inferior mobs grade 3 3x30 sec; piriformis stretch Standing lean on elbows on tall hi-lo table hip extension 10x right/left Discussed mid day 15 decompression since symptoms mostly present in the PM vs AM   PATIENT EDUCATION:  Education details: Educated pt on anatomy and  physiology of current symptoms, FOTO, diagnosis,  prognosis, HEP,  and POC. Person educated: Patient Education method: Customer service manager Education comprehension: verbalized understanding and returned demonstration  HOME EXERCISE PROGRAM: Access Code: [email or web address] URL: [email or web address] Date: 11/01/2022 Prepared by: Ruben Im  Exercises - Supine Lower Trunk Rotation  - 1 x daily - 7 x weekly - 2 sets - 10 reps - 2 hold - Supine Posterior Pelvic Tilt  - 1 x daily - 7 x weekly - 2 sets - 10 reps - 2 hold - Supine Single Knee to Chest Stretch  - 1 x daily - 7 x weekly - 3 sets - 10 reps - Standing Hip Abduction with Counter Support  - 2 x daily - 7 x weekly - 2 sets - 10 reps - Standing Hip Extension with Counter Support  - 2 x daily - 7 x weekly - 2 sets - 10 reps - Standing Hip Flexion with Counter Support  - 2 x daily - 7 x weekly - 2 sets - 10 reps - Side Stepping with Resistance at Ankles and Counter Support  - 2 x daily - 7 x weekly - 2 sets - 10 reps - Sit to Stand Without Arm Support  - 2 x daily - 7 x weekly - 2 sets - 10 reps - Seated Hamstring Stretch  - 2 x daily - 7 x weekly - 2 sets - 2 reps - 30 hold - Hip Flexor Stretch on Step  - 1 x daily - 7 x weekly - 1 sets - 10 reps - Quadruped Rocking Backward  - 1 x daily - 7 x weekly - 1 sets - 10 reps - Beginner Front Arm Support  - 1 x daily - 7 x weekly - 1 sets - 10 reps - Deadlift with Resistance  - 1 x daily - 7 x weekly - 1 sets - 10 reps - Single Leg Stance Stabilization with Slow Front to Back Band Oscillations - Anterior Anchor  - 1 x daily - 7 x weekly - 1 sets - 10 reps   ASSESSMENT:  CLINICAL IMPRESSION:  Able to progress lumbo/pelvic/hip strengthening in quadruped and standing positions. Therapist providing verbal cues to optimize technique for greater benefit.   He is able to perform these without pain.  Improved joint and soft tissue mobility noted with open books and QL lengthening  ex's.       OBJECTIVE IMPAIRMENTS: decreased activity tolerance, difficulty walking, decreased balance, decreased endurance, decreased mobility, decreased ROM, decreased strength, impaired flexibility, impaired UE/LE use, postural dysfunction, and pain.  ACTIVITY LIMITATIONS: bending, lifting, carry, locomotion, cleaning, community activity, driving, and or occupation  PERSONAL FACTORS: HTN are also affecting patient's functional outcome.  REHAB POTENTIAL: Good  CLINICAL DECISION MAKING: Stable/uncomplicated  EVALUATION COMPLEXITY: Low    GOALS: Short term PT Goals Target date: 10/23/2022 Pt will be I and compliant with HEP. Baseline:  Goal status: met 2/28 Pt will decrease pain by 25% overall Baseline: Goal status: met 3/7  Long term PT goals Target date: 11/20/2022 Pt will improve lumbar extension ROM to [facility] without repotted pain to improve functional mobility Baseline: Goal status: New Pt will improve hip abductor strength to at least 5-/5 MMT to improve functional strength Baseline: Goal status: New Pt will improve FOTO to at least 73% functional to show improved function Baseline: Goal status: New Pt will reduce pain by overall 50% overall with walking his dog.  Baseline: Goal status: New  PLAN: PT FREQUENCY: 1-2 times per week   PT DURATION:  6-8 weeks  PLANNED INTERVENTIONS (unless contraindicated): aquatic PT, Canalith repositioning, cryotherapy, Electrical stimulation, Iontophoresis with 4 mg/ml dexamethasome, Moist heat, traction, Ultrasound, gait training, Therapeutic exercise, balance training, neuromuscular re-education, patient/family education, prosthetic training, manual techniques, passive ROM, dry needling, taping, vasopnuematic device, vestibular, spinal manipulations, joint manipulations  PLAN FOR NEXT SESSION:  assess response to added green band ex's to HEP;  DN as needed; hip mobilizations; Assess HEP/update PRN, continue to progress functional  mobility (kneels at church, gets on floor for community outreach)  instruct in hip hinge; strengthen proximal hip muscles  Ruben Im, PT 11/01/22 10:59 AM Phone: [phone] Fax: [phone]

## 2022-11-02 ENCOUNTER — Encounter: Payer: Medicare Other | Admitting: Physical Therapy

## 2022-11-06 ENCOUNTER — Ambulatory Visit: Payer: Medicare Other | Admitting: Physical Therapy

## 2022-11-06 DIAGNOSIS — R2689 Other abnormalities of gait and mobility: Secondary | ICD-10-CM

## 2022-11-06 DIAGNOSIS — M5459 Other low back pain: Secondary | ICD-10-CM

## 2022-11-06 DIAGNOSIS — M6281 Muscle weakness (generalized): Secondary | ICD-10-CM | POA: Diagnosis not present

## 2022-11-06 NOTE — Therapy (Signed)
OUTPATIENT PHYSICAL THERAPY THORACOLUMBAR TREATMENT    Patient Name: Jonathan Knight MRN: [id number] DOB:October 25, 1945, 77 y.o., male, male Today's Date: 11/06/2022  END OF SESSION:  PT End of Session - 11/06/22 0838     Visit Number 8    Number of Visits 16    Date for PT Re-Evaluation 11/20/22    Authorization Type [employer]    Progress Note Due on Visit 10    PT Start Time 0840    PT Stop Time 0925    PT Time Calculation (min) 45 min    Activity Tolerance Patient tolerated treatment well               No past medical history on file. No past surgical history on file. There are no problems to display for this patient.  REFERRING PROVIDER: Saintclair Halsted, FNP  REFERRING DIAG: Acute right-sided low back pain without sciatica [M54.50]   Rationale for Evaluation and Treatment: Rehabilitation  THERAPY DIAG:  Other low back pain  Other abnormalities of gait and mobility  Muscle weakness (generalized)  ONSET DATE: 3-4 weeks ago  SUBJECTIVE:                                                                                                                                                                                           SUBJECTIVE STATEMENT:  Went 1/2 mile yesterday and had pain but not as intense as it used to be.  I had a little bit this morning walking in the house.  I feel like we've made progress.  I do the ex's at least 1x/day if not 2x/day.    Eval: Pt reports an insidious onset of L hip/ LBP that started about 4 weeks ago. He noted the pain when walking 1.5 miles with his dog. Pt notes relief with rest. He takes tylenol intermittently for pain. He is an avid golfer, but does not note pain with those movements.   PERTINENT HISTORY:  HTN  PAIN:  Are you having pain? Yes: NPRS scale: 0/10 Pain location: right hip/ LBP  Pain description: Grabbing pain, stabbing pain Aggravating factors: Walking, sleeping.   Relieving factors: Rest, tylenol    PRECAUTIONS: None  WEIGHT BEARING RESTRICTIONS: No  FALLS:  Has patient fallen in last 6 months? No  LIVING ENVIRONMENT: Lives with: lives with their spouse Lives in: House/apartment Stairs: Yes: Internal: 12 steps; on right going up Has following equipment at home: None  OCCUPATION: Retired   PLOF: Independent  PATIENT GOALS: Pt would like to get back to walking his dog without pain.   OBJECTIVE:   DIAGNOSTIC FINDINGS:  IMPRESSION: 1. No acute findings. 2. 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  PATIENT SURVEYS:  FOTO 54.93%, 73% in 9 visits.   SCREENING FOR RED FLAGS: Bowel or bladder incontinence: No  COGNITION: Overall cognitive status: Within functional limits for tasks assessed     SENSATION: WFL  POSTURE: No Significant postural limitations  PALPATION: No tenderness to palpation.   LUMBAR ROM:   AROM eval  Flexion 75% Pt able to touch his toes, but has tightness in bilat HS.  Extension 75% with mild 1/10 pain  Right lateral flexion WFL  Left lateral flexion WFL  Right rotation WFL  Left rotation WFL   (Blank rows = not tested)  LOWER EXTREMITY ROM:     Active  Right eval Left eval  Hip flexion [facility]  Hip internal rotation 80% 90%   Hip external rotation [facility]  Knee flexion [facility] WFL  Knee extension WFL WFL   (Blank rows = not tested)  LOWER EXTREMITY MMT:    MMT Right eval Left eval  Hip flexion 5/5 5/5  Knee flexion 5/5 5/5  Knee extension 5/5 5/5  Hip Abduction  4+/5 4/5   (Blank rows = not tested)  FUNCTIONAL TESTS:  6 minute walk test: 1217f  - 2 min onset of pain, constant pain at 3 min. Pt reports wanting to stop at 5 min if he was walking his dog.   GAIT: Distance walked: 13010f Assistive device utilized: None Level of assistance: Complete Independence Comments: mild trendelenburg   TODAY'S TREATMENT:    Date: 11/06/2022: Nu-Step 6 min, lvl 3.,  PT discussing status Doorway psoas stretch 5x 3 sets each  side  6 inch lateral step ups 5x each side 6  inch lateral step ups with hip abduction 5x each side ( just a little knee pain) Standing on green band: dead lifts 10x 20# kettlebell (15-20# bucket of birdseed at home) 15x to mid lower leg Green band anchored in door: kickstand position with diagonal UE extension pulls 10x each way Green band anchored in door:  UE pulls with opposite hip flexion 10x to each side (mild right lower back pain produced)  Gait down hallway to reproduce right lower back pain  Manual therapy: soft tissue mobilization to right gluteals and QL Sidelying neutral gapping grade 3/4 3x 30 sec; supine right long axis distraction, inferior distraction grade 3 3x 30 sec  Trigger Point Dry-Needling  Treatment instructions: Expect mild to moderate muscle soreness. S/S of pneumothorax if dry needled over a lung field, and to seek immediate medical attention should they occur. Patient verbalized understanding of these instructions and education.  Patient Consent Given: Yes Education handout provided: Yes Muscles treated: right QL and gluteals in sidelying over pillow Electrical stimulation performed: No Parameters: N/A Treatment response/outcome: improved soft tissue mobility       Date: 11/01/2022: Nu-Step 5 min, lvl 3.,  PT present for subjective Open books Supine blue ball piriformis/glute stretch Seated QL stretch off end of table 10x Quadruped alternating hip extension 10x Green band anchored in door: kickstand position with diagonal UE extension pulls 15x each way Green band anchored in door:  UE pulls with opposite hip flexion 10x to each side Standing on green band: dead lifts 10x      Date: 3/Mar 08, 2024Nu-Step 10 min, lvl 4.,  PT present for subjective Standing QL stretch 10x Seated QL stretch off end of table 10x 10# dumbbell by side with marching 5x 10# holding in 1  hand side bend to up straight 5x each side Red band pallof style trunk rotation 10x  right/left Manual therapy: soft tissue mobilization to gluteals (upper) Trigger Point Dry-Needling  Treatment instructions: Expect mild to moderate muscle soreness. S/S of pneumothorax if dry needled over a lung field, and to seek immediate medical attention should they occur. Patient verbalized understanding of these instructions and education.  Patient Consent Given: Yes Education handout provided: Yes Muscles treated:  gluteals  Electrical stimulation performed: No Parameters: N/A Treatment response/outcome: improved soft tissue mobility Moist heat 3 min   PATIENT EDUCATION:  Education details: Educated pt on anatomy and physiology of current symptoms, FOTO, diagnosis, prognosis, HEP,  and POC. Person educated: Patient Education method: Customer service manager Education comprehension: verbalized understanding and returned demonstration  HOME EXERCISE PROGRAM: Access Code: [email or web address] URL: [email or web address] Date: 11/01/2022 Prepared by: Ruben Im  Exercises - Supine Lower Trunk Rotation  - 1 x daily - 7 x weekly - 2 sets - 10 reps - 2 hold - Supine Posterior Pelvic Tilt  - 1 x daily - 7 x weekly - 2 sets - 10 reps - 2 hold - Supine Single Knee to Chest Stretch  - 1 x daily - 7 x weekly - 3 sets - 10 reps - Standing Hip Abduction with Counter Support  - 2 x daily - 7 x weekly - 2 sets - 10 reps - Standing Hip Extension with Counter Support  - 2 x daily - 7 x weekly - 2 sets - 10 reps - Standing Hip Flexion with Counter Support  - 2 x daily - 7 x weekly - 2 sets - 10 reps - Side Stepping with Resistance at Ankles and Counter Support  - 2 x daily - 7 x weekly - 2 sets - 10 reps - Sit to Stand Without Arm Support  - 2 x daily - 7 x weekly - 2 sets - 10 reps - Seated Hamstring Stretch  - 2 x daily - 7 x weekly - 2 sets - 2 reps - 30 hold - Hip Flexor Stretch on Step  - 1 x daily - 7 x weekly - 1 sets - 10 reps - Quadruped Rocking Backward  - 1 x daily - 7 x  weekly - 1 sets - 10 reps - Beginner Front Arm Support  - 1 x daily - 7 x weekly - 1 sets - 10 reps - Deadlift with Resistance  - 1 x daily - 7 x weekly - 1 sets - 10 reps - Single Leg Stance Stabilization with Slow Front to Back Band Oscillations - Anterior Anchor  - 1 x daily - 7 x weekly - 1 sets - 10 reps   ASSESSMENT:  CLINICAL IMPRESSION:  Pain is still present with walking the dog but reports it is much less intense than it was and he is able to go 1/2 mile.  Able to progress trunk and lower quarter strengthening today to include loaded dead lifts.  Verbal cues for head position.  Some discomfort reproduced with walking and single leg standing ex's on right but resolves quickly.  Much improved QL length following DN and manual therapy.        OBJECTIVE IMPAIRMENTS: decreased activity tolerance, difficulty walking, decreased balance, decreased endurance, decreased mobility, decreased ROM, decreased strength, impaired flexibility, impaired UE/LE use, postural dysfunction, and pain.  ACTIVITY LIMITATIONS: bending, lifting, carry, locomotion, cleaning, community activity, driving, and or occupation  PERSONAL FACTORS: HTN are also affecting patient's functional outcome.  REHAB POTENTIAL: Good  CLINICAL DECISION MAKING: Stable/uncomplicated  EVALUATION COMPLEXITY: Low    GOALS: Short term PT Goals Target date: 10/23/2022 Pt will be I and compliant with HEP. Baseline:  Goal status: met 2/28 Pt will decrease pain by 25% overall Baseline: Goal status: met 3/7  Long term PT goals Target date: 11/20/2022 Pt will improve lumbar extension ROM to [facility] without repotted pain to improve functional mobility Baseline: Goal status: New Pt will improve hip abductor strength to at least 5-/5 MMT to improve functional strength Baseline: Goal status: New Pt will improve FOTO to at least 73% functional to show improved function Baseline: Goal status: New Pt will reduce pain by overall 50%  overall with walking his dog.  Baseline: Goal status: New  PLAN: PT FREQUENCY: 1-2 times per week   PT DURATION: 6-8 weeks  PLANNED INTERVENTIONS (unless contraindicated): aquatic PT, Canalith repositioning, cryotherapy, Electrical stimulation, Iontophoresis with 4 mg/ml dexamethasome, Moist heat, traction, Ultrasound, gait training, Therapeutic exercise, balance training, neuromuscular re-education, patient/family education, prosthetic training, manual techniques, passive ROM, dry needling, taping, vasopnuematic device, vestibular, spinal manipulations, joint manipulations  PLAN FOR NEXT SESSION:   review dead lift with kettlebell may be able to progress to barbell;  DN as needed to right QL and upper glutes; right hip mobilizations; Assess HEP/update PRN, continue to progress functional mobility (kneels at church, gets on floor for community outreach)  instruct in hip hinge; strengthen proximal hip muscles  Ruben Im, PT 11/06/22 9:49 AM Phone: [phone] Fax: [phone]

## 2022-11-09 ENCOUNTER — Ambulatory Visit: Payer: Medicare Other | Admitting: Physical Therapy

## 2022-11-09 DIAGNOSIS — R2689 Other abnormalities of gait and mobility: Secondary | ICD-10-CM

## 2022-11-09 DIAGNOSIS — M5459 Other low back pain: Secondary | ICD-10-CM

## 2022-11-09 DIAGNOSIS — M6281 Muscle weakness (generalized): Secondary | ICD-10-CM | POA: Diagnosis not present

## 2022-11-09 NOTE — Therapy (Signed)
OUTPATIENT PHYSICAL THERAPY THORACOLUMBAR TREATMENT    Patient Name: Jonathan Knight MRN: [id number] DOB:09-Sep-1945, 77 y.o., male Today's Date: 11/09/2022  END OF SESSION:  PT End of Session - 11/09/22 0844     Visit Number 9    Number of Visits 16    Date for PT Re-Evaluation 11/20/22    Authorization Type [employer]    Progress Note Due on Visit 10    PT Start Time [phone]    PT Stop Time 0925    PT Time Calculation (min) 42 min    Activity Tolerance Patient tolerated treatment well               No past medical history on file. No past surgical history on file. There are no problems to display for this patient.  REFERRING PROVIDER: Saintclair Halsted, FNP  REFERRING DIAG: Acute right-sided low back pain without sciatica [M54.50]   Rationale for Evaluation and Treatment: Rehabilitation  THERAPY DIAG:  Other low back pain  Other abnormalities of gait and mobility  Muscle weakness (generalized)  ONSET DATE: 3-4 weeks ago  SUBJECTIVE:                                                                                                                                                                                           SUBJECTIVE STATEMENT:   I think the needles help.  I was able to walk 1 mile the other day,  some back pain but then a 5 sec rest improves.  Walking uphill seems to be a little worse.    Eval: Pt reports an insidious onset of L hip/ LBP that started about 4 weeks ago. He noted the pain when walking 1.5 miles with his dog. Pt notes relief with rest. He takes tylenol intermittently for pain. He is an avid golfer, but does not note pain with those movements.   PERTINENT HISTORY:  HTN  PAIN:  Are you having pain? Yes: NPRS scale: 0/10 Pain location: right hip/ LBP  Pain description: Grabbing pain, stabbing pain Aggravating factors: Walking, sleeping.   Relieving factors: Rest, tylenol   PRECAUTIONS: None  WEIGHT BEARING  RESTRICTIONS: No  FALLS:  Has patient fallen in last 6 months? No  LIVING ENVIRONMENT: Lives with: lives with their spouse Lives in: House/apartment Stairs: Yes: Internal: 12 steps; on right going up Has following equipment at home: None  OCCUPATION: Retired   PLOF: Independent  PATIENT GOALS: Pt would like to get back to walking his dog without pain.   OBJECTIVE:   DIAGNOSTIC FINDINGS:  IMPRESSION: 1. No acute findings. 2. Mild degenerative  change of the left hip.  PATIENT SURVEYS:  FOTO 54.93%, 73% in 9 visits.   SCREENING FOR RED FLAGS: Bowel or bladder incontinence: No  COGNITION: Overall cognitive status: Within functional limits for tasks assessed     SENSATION: WFL  POSTURE: No Significant postural limitations  PALPATION: No tenderness to palpation.   LUMBAR ROM:   AROM eval 3/7  Flexion 75% Pt able to touch his toes, but has tightness in bilat HS. Fingers to lower leg  Extension 75% with mild 1/10 pain 10% movement restriction  Right lateral flexion WFL   Left lateral flexion WFL   Right rotation WFL   Left rotation WFL    (Blank rows = not tested)  LOWER EXTREMITY ROM:     Active  Right eval Left eval  Hip flexion [facility]  Hip internal rotation 80% 90%   Hip external rotation [facility]  Knee flexion [facility] WFL  Knee extension WFL WFL   (Blank rows = not tested)  LOWER EXTREMITY MMT:    MMT Right eval Left eval  Hip flexion 5/5 5/5  Knee flexion 5/5 5/5  Knee extension 5/5 5/5  Hip Abduction  4+/5 4/5   (Blank rows = not tested)  FUNCTIONAL TESTS:  6 minute walk test: 1278ft  - 2 min onset of pain, constant pain at 3 min. Pt reports wanting to stop at 5 min if he was walking his dog.   GAIT: Distance walked: 1338ft  Assistive device utilized: None Level of assistance: Complete Independence Comments: mild trendelenburg   TODAY'S TREATMENT:   Date: 11/09/2022: Nu-Step 7 min, lvl 3.,  PT discussing status Review of stretching  program Discussion of benefits of strength training, patient given empty gallon jugs to use for home; patient considering purchasing 10# weights Dead lifts with 2 8# dumbbells by sides of the body reaching to just below knees (cues to squeeze shoulder blades together on the rise) 2x10 Squats with  8# dumbbell under chin 2x10 Staggered stance with 8# reach toward front knee 10x right/left      Date: 11/06/2022: Nu-Step 6 min, lvl 3.,  PT discussing status Doorway psoas stretch 5x 3 sets each side  6 inch lateral step ups 5x each side 6  inch lateral step ups with hip abduction 5x each side ( just a little knee pain) Standing on green band: dead lifts 10x 20# kettlebell (15-20# bucket of birdseed at home) 15x to mid lower leg Green band anchored in door: kickstand position with diagonal UE extension pulls 10x each way Green band anchored in door:  UE pulls with opposite hip flexion 10x to each side (mild right lower back pain produced)  Gait down hallway to reproduce right lower back pain  Manual therapy: soft tissue mobilization to right gluteals and QL Sidelying neutral gapping grade 3/4 3x 30 sec; supine right long axis distraction, inferior distraction grade 3 3x 30 sec  Trigger Point Dry-Needling  Treatment instructions: Expect mild to moderate muscle soreness. S/S of pneumothorax if dry needled over a lung field, and to seek immediate medical attention should they occur. Patient verbalized understanding of these instructions and education.  Patient Consent Given: Yes Education handout provided: Yes Muscles treated: right QL and gluteals in sidelying over pillow Electrical stimulation performed: No Parameters: N/A Treatment response/outcome: improved soft tissue mobility       Date: 11/01/2022: Nu-Step 5 min, lvl 3.,  PT present for subjective Open books Supine blue ball piriformis/glute stretch Seated QL stretch off  end of table 10x Quadruped alternating hip extension  10x Green band anchored in door: kickstand position with diagonal UE extension pulls 15x each way Green band anchored in door:  UE pulls with opposite hip flexion 10x to each side Standing on green band: dead lifts 10x       PATIENT EDUCATION:  Education details: Educated pt on anatomy and physiology of current symptoms, FOTO, diagnosis, prognosis, HEP,  and POC. Person educated: Patient Education method: Customer service manager Education comprehension: verbalized understanding and returned demonstration  HOME EXERCISE PROGRAM: Access Code: [email or web address] URL: [email or web address] Date: 11/09/2022 Prepared by: Ruben Im  Exercises - Supine Lower Trunk Rotation  - 1 x daily - 7 x weekly - 2 sets - 10 reps - 2 hold - Supine Posterior Pelvic Tilt  - 1 x daily - 7 x weekly - 2 sets - 10 reps - 2 hold - Supine Single Knee to Chest Stretch  - 1 x daily - 7 x weekly - 3 sets - 10 reps - Standing Hip Abduction with Counter Support  - 2 x daily - 7 x weekly - 2 sets - 10 reps - Standing Hip Extension with Counter Support  - 2 x daily - 7 x weekly - 2 sets - 10 reps - Standing Hip Flexion with Counter Support  - 2 x daily - 7 x weekly - 2 sets - 10 reps - Side Stepping with Resistance at Ankles and Counter Support  - 2 x daily - 7 x weekly - 2 sets - 10 reps - Sit to Stand Without Arm Support  - 2 x daily - 7 x weekly - 2 sets - 10 reps - Seated Hamstring Stretch  - 2 x daily - 7 x weekly - 2 sets - 2 reps - 30 hold - Hip Flexor Stretch on Step  - 1 x daily - 7 x weekly - 1 sets - 10 reps - Quadruped Rocking Backward  - 1 x daily - 7 x weekly - 1 sets - 10 reps - Beginner Front Arm Support  - 1 x daily - 7 x weekly - 1 sets - 10 reps - Deadlift with Resistance  - 1 x daily - 7 x weekly - 1 sets - 10 reps - Single Leg Stance Stabilization with Slow Front to Back Band Oscillations - Anterior Anchor  - 1 x daily - 7 x weekly - 1 sets - 10 reps - Half Deadlift with Kettlebell   - 1 x daily - 7 x weekly - 1 sets - 10 reps - Goblet Squat with Kettlebell  - 1 x daily - 7 x weekly - 1 sets - 10 reps ASSESSMENT:  CLINICAL IMPRESSION:  Patient is progressing well with pain reduction.  Able to walk 1 mile with low level pain, relieved with a short rest.  He has benefited from DN to upper gluteals and QL.  He is highly compliant with his HEP with mobility/stretching.  Encouraged a progression of strengthening from resistance band to dumbbells.  He demonstrates good technique with dead lifts and squats.  Verbal cues for initial set up of ex's.      OBJECTIVE IMPAIRMENTS: decreased activity tolerance, difficulty walking, decreased balance, decreased endurance, decreased mobility, decreased ROM, decreased strength, impaired flexibility, impaired UE/LE use, postural dysfunction, and pain.  ACTIVITY LIMITATIONS: bending, lifting, carry, locomotion, cleaning, community activity, driving, and or occupation  PERSONAL FACTORS: HTN are also affecting patient's functional outcome.  REHAB POTENTIAL: Good  CLINICAL DECISION  MAKING: Stable/uncomplicated  EVALUATION COMPLEXITY: Low    GOALS: Short term PT Goals Target date: 10/23/2022 Pt will be I and compliant with HEP. Baseline:  Goal status: met 2/28 Pt will decrease pain by 25% overall Baseline: Goal status: met 3/7  Long term PT goals Target date: 11/20/2022 Pt will improve lumbar extension ROM to [facility] without repotted pain to improve functional mobility Baseline: Goal status: New Pt will improve hip abductor strength to at least 5-/5 MMT to improve functional strength Baseline: Goal status: New Pt will improve FOTO to at least 73% functional to show improved function Baseline: Goal status: New Pt will reduce pain by overall 50% overall with walking his dog.  Baseline: Goal status: New  PLAN: PT FREQUENCY: 1-2 times per week   PT DURATION: 6-8 weeks  PLANNED INTERVENTIONS (unless contraindicated): aquatic PT,  Canalith repositioning, cryotherapy, Electrical stimulation, Iontophoresis with 4 mg/ml dexamethasome, Moist heat, traction, Ultrasound, gait training, Therapeutic exercise, balance training, neuromuscular re-education, patient/family education, prosthetic training, manual techniques, passive ROM, dry needling, taping, vasopnuematic device, vestibular, spinal manipulations, joint manipulations  PLAN FOR NEXT SESSION: 10th visit note;  FOTO; check progress toward goals;  DN as needed to right QL and upper glutes; strength training; hip mobilizations; Assess HEP/update PRN, continue to progress functional mobility (kneels at church, gets on floor for community outreach)    Ruben Im, PT 11/09/22 9:28 AM Phone: [phone] Fax: [phone]

## 2022-11-12 ENCOUNTER — Encounter: Payer: Self-pay | Admitting: Physical Therapy

## 2022-11-12 ENCOUNTER — Ambulatory Visit: Payer: Medicare Other | Admitting: Physical Therapy

## 2022-11-12 DIAGNOSIS — M6281 Muscle weakness (generalized): Secondary | ICD-10-CM

## 2022-11-12 DIAGNOSIS — R2689 Other abnormalities of gait and mobility: Secondary | ICD-10-CM | POA: Diagnosis not present

## 2022-11-12 DIAGNOSIS — M5459 Other low back pain: Secondary | ICD-10-CM | POA: Diagnosis not present

## 2022-11-12 NOTE — Therapy (Signed)
OUTPATIENT PHYSICAL THERAPY THORACOLUMBAR TREATMENT    Patient Name: Jonathan Knight MRN: [id number] DOB:[birth year], 77 y.o., male Today's Date: 11/12/2022  END OF SESSION:  PT End of Session - 11/12/22 1149     Visit Number 10    Number of Visits 16    Date for PT Re-Evaluation 11/20/22    Authorization Type [employer]    Progress Note Due on Visit 10    PT Start Time 1146    Activity Tolerance Patient tolerated treatment well    Behavior During Therapy [facility] for tasks assessed/performed                History reviewed. No pertinent past medical history. History reviewed. No pertinent surgical history. There are no problems to display for this patient.  REFERRING PROVIDER: Saintclair Halsted, FNP  REFERRING DIAG: Acute right-sided low back pain without sciatica [M54.50]   Rationale for Evaluation and Treatment: Rehabilitation  THERAPY DIAG:  Other low back pain  Muscle weakness (generalized)  Other abnormalities of gait and mobility  ONSET DATE: 3-4 weeks ago  SUBJECTIVE:                                                                                                                                                                                           SUBJECTIVE STATEMENT:   I think the needles help.  I was able to walk 1 mile the other day,  some back pain but then a 5 sec rest improves.  Walking uphill seems to be a little worse.    Eval: Pt reports an insidious onset of L hip/ LBP that started about 4 weeks ago. He noted the pain when walking 1.5 miles with his dog. Pt notes relief with rest. He takes tylenol intermittently for pain. He is an avid golfer, but does not note pain with those movements.   PERTINENT HISTORY:  HTN  PAIN:  Are you having pain? Yes: NPRS scale: 0/10 Pain location: right hip/ LBP  Pain description: Grabbing pain, stabbing pain Aggravating factors: Walking, sleeping.   Relieving factors: Rest, tylenol    PRECAUTIONS: None  WEIGHT BEARING RESTRICTIONS: No  FALLS:  Has patient fallen in last 6 months? No  LIVING ENVIRONMENT: Lives with: lives with their spouse Lives in: House/apartment Stairs: Yes: Internal: 12 steps; on right going up Has following equipment at home: None  OCCUPATION: Retired   PLOF: Independent  PATIENT GOALS: Pt would like to get back to walking his dog without pain.   OBJECTIVE:   DIAGNOSTIC FINDINGS:  IMPRESSION: 1. No acute findings. 2. Mild degenerative change of the left  hip.  PATIENT SURVEYS:  FOTO 54.93%, 73% in 9 visits.   SCREENING FOR RED FLAGS: Bowel or bladder incontinence: No  COGNITION: Overall cognitive status: Within functional limits for tasks assessed     SENSATION: WFL  POSTURE: No Significant postural limitations  PALPATION: No tenderness to palpation.   LUMBAR ROM:   AROM eval 3/7 11/12/2022  Flexion 75% Pt able to touch his toes, but has tightness in bilat HS. Fingers to lower leg Pt is able to touch toes, but has tight HS causing flexion in bilat knees.   Extension 75% with mild 1/10 pain 10% movement restriction [facility]  Right lateral flexion WFL    Left lateral flexion WFL    Right rotation [facility]    Left rotation WFL     (Blank rows = not tested)  LOWER EXTREMITY ROM:     Active  Right eval Left eval R/L 11/12/2022   Hip flexion [facility]   Hip internal rotation 80% 90%  90%   Hip external rotation [facility]   Knee flexion [facility] WFL   Knee extension WFL WFL    (Blank rows = not tested)  LOWER EXTREMITY MMT:    MMT Right eval Left eval R/L 11/12/2022  Hip flexion 5/5 5/5   Knee flexion 5/5 5/5   Knee extension 5/5 5/5   Hip Abduction  4+/5 4/5    (Blank rows = not tested)  FUNCTIONAL TESTS:  6 minute walk test: 1280ft  - 2 min onset of pain, constant pain at 3 min. Pt reports wanting to stop at 5 min if he was walking his dog.  6 minute walk test: 1667ft  No pain today   GAIT: Distance walked:  1366ft  Assistive device utilized: None Level of assistance: Complete Independence Comments: mild trendelenburg   TODAY'S TREATMENT:   Date: 11/12/2022: Nu-Step 7 min, lvl 3.,  PT discussing status Squats with  8# dumbbell under chin 1x10 Staggered stance with 8# reach toward front knee 10x right/left Matrix hip abd/flex/ ext x10, bilat  6 MWT  Date: 11/09/2022: Nu-Step 7 min, lvl 3.,  PT discussing status Review of stretching program Discussion of benefits of strength training, patient given empty gallon jugs to use for home; patient considering purchasing 10# weights Dead lifts with 2 8# dumbbells by sides of the body reaching to just below knees (cues to squeeze shoulder blades together on the rise) 2x10 Squats with  8# dumbbell under chin 2x10 Staggered stance with 8# reach toward front knee 10x right/left   Date: 11/06/2022: Nu-Step 6 min, lvl 3.,  PT discussing status Doorway psoas stretch 5x 3 sets each side  6 inch lateral step ups 5x each side 6  inch lateral step ups with hip abduction 5x each side ( just a little knee pain) Standing on green band: dead lifts 10x 20# kettlebell (15-20# bucket of birdseed at home) 15x to mid lower leg Green band anchored in door: kickstand position with diagonal UE extension pulls 10x each way Green band anchored in door:  UE pulls with opposite hip flexion 10x to each side (mild right lower back pain produced)  Gait down hallway to reproduce right lower back pain  Manual therapy: soft tissue mobilization to right gluteals and QL Sidelying neutral gapping grade 3/4 3x 30 sec; supine right long axis distraction, inferior distraction grade 3 3x 30 sec  Trigger Point Dry-Needling  Treatment instructions: Expect mild to moderate muscle soreness. S/S of pneumothorax if dry needled over a  lung field, and to seek immediate medical attention should they occur. Patient verbalized understanding of these instructions and education.  Patient Consent  Given: Yes Education handout provided: Yes Muscles treated: right QL and gluteals in sidelying over pillow Electrical stimulation performed: No Parameters: N/A Treatment response/outcome: improved soft tissue mobility   Date: 11/01/2022: Nu-Step 5 min, lvl 3.,  PT present for subjective Open books Supine blue ball piriformis/glute stretch Seated QL stretch off end of table 10x Quadruped alternating hip extension 10x Green band anchored in door: kickstand position with diagonal UE extension pulls 15x each way Green band anchored in door:  UE pulls with opposite hip flexion 10x to each side Standing on green band: dead lifts 10x       PATIENT EDUCATION:  Education details: Educated pt on anatomy and physiology of current symptoms, FOTO, diagnosis, prognosis, HEP,  and POC. Person educated: Patient Education method: Customer service manager Education comprehension: verbalized understanding and returned demonstration  HOME EXERCISE PROGRAM: Access Code: [email or web address] URL: [email or web address] Date: 11/09/2022 Prepared by: Ruben Im  Exercises - Supine Lower Trunk Rotation  - 1 x daily - 7 x weekly - 2 sets - 10 reps - 2 hold - Supine Posterior Pelvic Tilt  - 1 x daily - 7 x weekly - 2 sets - 10 reps - 2 hold - Supine Single Knee to Chest Stretch  - 1 x daily - 7 x weekly - 3 sets - 10 reps - Standing Hip Abduction with Counter Support  - 2 x daily - 7 x weekly - 2 sets - 10 reps - Standing Hip Extension with Counter Support  - 2 x daily - 7 x weekly - 2 sets - 10 reps - Standing Hip Flexion with Counter Support  - 2 x daily - 7 x weekly - 2 sets - 10 reps - Side Stepping with Resistance at Ankles and Counter Support  - 2 x daily - 7 x weekly - 2 sets - 10 reps - Sit to Stand Without Arm Support  - 2 x daily - 7 x weekly - 2 sets - 10 reps - Seated Hamstring Stretch  - 2 x daily - 7 x weekly - 2 sets - 2 reps - 30 hold - Hip Flexor Stretch on Step  - 1 x daily -  7 x weekly - 1 sets - 10 reps - Quadruped Rocking Backward  - 1 x daily - 7 x weekly - 1 sets - 10 reps - Beginner Front Arm Support  - 1 x daily - 7 x weekly - 1 sets - 10 reps - Deadlift with Resistance  - 1 x daily - 7 x weekly - 1 sets - 10 reps - Single Leg Stance Stabilization with Slow Front to Back Band Oscillations - Anterior Anchor  - 1 x daily - 7 x weekly - 1 sets - 10 reps - Half Deadlift with Kettlebell  - 1 x daily - 7 x weekly - 1 sets - 10 reps - Goblet Squat with Kettlebell  - 1 x daily - 7 x weekly - 1 sets - 10 reps ASSESSMENT:  CLINICAL IMPRESSION:  Patient presents to PT with reports of no pain and 90% functional improvements since starting. He states that he is ready to be discharged today to his comprehensive HEP. Pt reports intermittent pain that comes on with prolonged walking and subsides with 5 sec of  rest. He has met all his personal and functional goals. He no longer has  pain with his 6MWT and demonstrates improved gait with minimal hip drop. Pt will continue to benefit from skilled PT to address continued deficits.     OBJECTIVE IMPAIRMENTS: decreased activity tolerance, difficulty walking, decreased balance, decreased endurance, decreased mobility, decreased ROM, decreased strength, impaired flexibility, impaired UE/LE use, postural dysfunction, and pain.  ACTIVITY LIMITATIONS: bending, lifting, carry, locomotion, cleaning, community activity, driving, and or occupation  PERSONAL FACTORS: HTN are also affecting patient's functional outcome.  REHAB POTENTIAL: Good  CLINICAL DECISION MAKING: Stable/uncomplicated  EVALUATION COMPLEXITY: Low    GOALS: Short term PT Goals Target date: 10/23/2022 Pt will be I and compliant with HEP. Baseline:  Goal status: met 2/28 Pt will decrease pain by 25% overall Baseline: Goal status: met 3/7  Long term PT goals Target date: 11/20/2022 Pt will improve lumbar extension ROM to [facility] without repotted pain to improve  functional mobility Baseline: Goal status:  Pt will improve hip abductor strength to at least 5-/5 MMT to improve functional strength Baseline: Goal status: MET  Pt will improve FOTO to at least 73% functional to show improved function Baseline: Goal status: 88.2300 MET  Pt will reduce pain by overall 50% overall with walking his dog.  Baseline: Goal status: Met   Rudi Heap PT, DPT 11/12/22  11:51 AM

## 2022-11-19 ENCOUNTER — Ambulatory Visit: Payer: Medicare Other | Admitting: Physical Therapy

## 2023-02-21 DIAGNOSIS — S90561A Insect bite (nonvenomous), right ankle, initial encounter: Secondary | ICD-10-CM | POA: Diagnosis not present

## 2023-02-21 DIAGNOSIS — R634 Abnormal weight loss: Secondary | ICD-10-CM | POA: Diagnosis not present

## 2023-02-21 DIAGNOSIS — L989 Disorder of the skin and subcutaneous tissue, unspecified: Secondary | ICD-10-CM | POA: Diagnosis not present

## 2023-02-21 DIAGNOSIS — M25562 Pain in left knee: Secondary | ICD-10-CM | POA: Diagnosis not present

## 2023-02-25 DIAGNOSIS — R634 Abnormal weight loss: Secondary | ICD-10-CM | POA: Diagnosis not present

## 2023-02-26 DIAGNOSIS — L720 Epidermal cyst: Secondary | ICD-10-CM | POA: Diagnosis not present

## 2023-02-26 DIAGNOSIS — L02212 Cutaneous abscess of back [any part, except buttock]: Secondary | ICD-10-CM | POA: Diagnosis not present

## 2023-04-03 DIAGNOSIS — M1712 Unilateral primary osteoarthritis, left knee: Secondary | ICD-10-CM | POA: Diagnosis not present

## 2023-04-17 DIAGNOSIS — M1712 Unilateral primary osteoarthritis, left knee: Secondary | ICD-10-CM | POA: Diagnosis not present

## 2023-04-23 DIAGNOSIS — Z0000 Encounter for general adult medical examination without abnormal findings: Secondary | ICD-10-CM | POA: Diagnosis not present

## 2023-04-23 DIAGNOSIS — Z125 Encounter for screening for malignant neoplasm of prostate: Secondary | ICD-10-CM | POA: Diagnosis not present

## 2023-04-23 DIAGNOSIS — M1712 Unilateral primary osteoarthritis, left knee: Secondary | ICD-10-CM | POA: Diagnosis not present

## 2023-04-23 DIAGNOSIS — I10 Essential (primary) hypertension: Secondary | ICD-10-CM | POA: Diagnosis not present

## 2023-04-23 DIAGNOSIS — E7800 Pure hypercholesterolemia, unspecified: Secondary | ICD-10-CM | POA: Diagnosis not present

## 2023-04-23 DIAGNOSIS — R7303 Prediabetes: Secondary | ICD-10-CM | POA: Diagnosis not present

## 2023-05-08 DIAGNOSIS — I700 Atherosclerosis of aorta: Secondary | ICD-10-CM | POA: Diagnosis not present

## 2023-05-08 DIAGNOSIS — N281 Cyst of kidney, acquired: Secondary | ICD-10-CM | POA: Diagnosis not present

## 2023-05-08 DIAGNOSIS — I2510 Atherosclerotic heart disease of native coronary artery without angina pectoris: Secondary | ICD-10-CM | POA: Diagnosis not present

## 2023-05-08 DIAGNOSIS — R634 Abnormal weight loss: Secondary | ICD-10-CM | POA: Diagnosis not present

## 2023-05-08 DIAGNOSIS — K5730 Diverticulosis of large intestine without perforation or abscess without bleeding: Secondary | ICD-10-CM | POA: Diagnosis not present

## 2023-05-08 DIAGNOSIS — N400 Enlarged prostate without lower urinary tract symptoms: Secondary | ICD-10-CM | POA: Diagnosis not present

## 2023-05-08 DIAGNOSIS — R911 Solitary pulmonary nodule: Secondary | ICD-10-CM | POA: Diagnosis not present

## 2023-05-20 DIAGNOSIS — H5212 Myopia, left eye: Secondary | ICD-10-CM | POA: Diagnosis not present

## 2023-06-17 DIAGNOSIS — H40013 Open angle with borderline findings, low risk, bilateral: Secondary | ICD-10-CM | POA: Diagnosis not present

## 2023-06-17 DIAGNOSIS — H40053 Ocular hypertension, bilateral: Secondary | ICD-10-CM | POA: Diagnosis not present

## 2023-06-20 DIAGNOSIS — H401131 Primary open-angle glaucoma, bilateral, mild stage: Secondary | ICD-10-CM | POA: Diagnosis not present

## 2023-06-25 DIAGNOSIS — H401131 Primary open-angle glaucoma, bilateral, mild stage: Secondary | ICD-10-CM | POA: Diagnosis not present

## 2023-06-25 DIAGNOSIS — H18413 Arcus senilis, bilateral: Secondary | ICD-10-CM | POA: Diagnosis not present

## 2023-06-25 DIAGNOSIS — H353131 Nonexudative age-related macular degeneration, bilateral, early dry stage: Secondary | ICD-10-CM | POA: Diagnosis not present

## 2023-06-25 DIAGNOSIS — H2512 Age-related nuclear cataract, left eye: Secondary | ICD-10-CM | POA: Diagnosis not present

## 2023-06-25 DIAGNOSIS — H2513 Age-related nuclear cataract, bilateral: Secondary | ICD-10-CM | POA: Diagnosis not present

## 2023-06-27 DIAGNOSIS — Z860101 Personal history of adenomatous and serrated colon polyps: Secondary | ICD-10-CM | POA: Diagnosis not present

## 2023-06-27 DIAGNOSIS — K5730 Diverticulosis of large intestine without perforation or abscess without bleeding: Secondary | ICD-10-CM | POA: Diagnosis not present

## 2023-06-27 DIAGNOSIS — K648 Other hemorrhoids: Secondary | ICD-10-CM | POA: Diagnosis not present

## 2023-06-27 DIAGNOSIS — D120 Benign neoplasm of cecum: Secondary | ICD-10-CM | POA: Diagnosis not present

## 2023-06-27 DIAGNOSIS — Z09 Encounter for follow-up examination after completed treatment for conditions other than malignant neoplasm: Secondary | ICD-10-CM | POA: Diagnosis not present

## 2023-07-01 DIAGNOSIS — Z85828 Personal history of other malignant neoplasm of skin: Secondary | ICD-10-CM | POA: Diagnosis not present

## 2023-07-01 DIAGNOSIS — L814 Other melanin hyperpigmentation: Secondary | ICD-10-CM | POA: Diagnosis not present

## 2023-07-01 DIAGNOSIS — D225 Melanocytic nevi of trunk: Secondary | ICD-10-CM | POA: Diagnosis not present

## 2023-07-01 DIAGNOSIS — L570 Actinic keratosis: Secondary | ICD-10-CM | POA: Diagnosis not present

## 2023-07-01 DIAGNOSIS — L821 Other seborrheic keratosis: Secondary | ICD-10-CM | POA: Diagnosis not present

## 2023-07-01 DIAGNOSIS — L820 Inflamed seborrheic keratosis: Secondary | ICD-10-CM | POA: Diagnosis not present

## 2023-07-04 DIAGNOSIS — D120 Benign neoplasm of cecum: Secondary | ICD-10-CM | POA: Diagnosis not present

## 2023-08-30 DIAGNOSIS — H2511 Age-related nuclear cataract, right eye: Secondary | ICD-10-CM | POA: Diagnosis not present

## 2023-08-30 DIAGNOSIS — H2512 Age-related nuclear cataract, left eye: Secondary | ICD-10-CM | POA: Diagnosis not present

## 2023-09-27 DIAGNOSIS — H2511 Age-related nuclear cataract, right eye: Secondary | ICD-10-CM | POA: Diagnosis not present

## 2023-10-16 DIAGNOSIS — M1712 Unilateral primary osteoarthritis, left knee: Secondary | ICD-10-CM | POA: Diagnosis not present

## 2023-10-22 DIAGNOSIS — Z125 Encounter for screening for malignant neoplasm of prostate: Secondary | ICD-10-CM | POA: Diagnosis not present

## 2023-10-22 DIAGNOSIS — I10 Essential (primary) hypertension: Secondary | ICD-10-CM | POA: Diagnosis not present

## 2023-10-22 DIAGNOSIS — R7303 Prediabetes: Secondary | ICD-10-CM | POA: Diagnosis not present

## 2023-10-22 DIAGNOSIS — E7800 Pure hypercholesterolemia, unspecified: Secondary | ICD-10-CM | POA: Diagnosis not present

## 2023-12-17 DIAGNOSIS — M79651 Pain in right thigh: Secondary | ICD-10-CM | POA: Diagnosis not present

## 2023-12-17 DIAGNOSIS — M1611 Unilateral primary osteoarthritis, right hip: Secondary | ICD-10-CM | POA: Diagnosis not present

## 2024-01-08 ENCOUNTER — Other Ambulatory Visit: Payer: Self-pay

## 2024-01-08 ENCOUNTER — Ambulatory Visit: Attending: Family Medicine | Admitting: Physical Therapy

## 2024-01-08 ENCOUNTER — Encounter: Payer: Self-pay | Admitting: Physical Therapy

## 2024-01-08 DIAGNOSIS — R262 Difficulty in walking, not elsewhere classified: Secondary | ICD-10-CM | POA: Diagnosis not present

## 2024-01-08 DIAGNOSIS — M25651 Stiffness of right hip, not elsewhere classified: Secondary | ICD-10-CM | POA: Insufficient documentation

## 2024-01-08 DIAGNOSIS — M25551 Pain in right hip: Secondary | ICD-10-CM | POA: Diagnosis not present

## 2024-01-08 DIAGNOSIS — M6281 Muscle weakness (generalized): Secondary | ICD-10-CM | POA: Insufficient documentation

## 2024-01-08 NOTE — Therapy (Signed)
 OUTPATIENT PHYSICAL THERAPY LOWER EXTREMITY EVALUATION   Patient Name: Jonathan Knight MRN: [id number] DOB:1946-08-09, 78 y.o., male Today's Date: 01/08/2024  END OF SESSION:  PT End of Session - 01/08/24 1022     Visit Number 1    Date for PT Re-Evaluation 03/04/24    Authorization Type [employer] Medicare    PT Start Time 1018    PT Stop Time 1100    PT Time Calculation (min) 42 min    Activity Tolerance Patient tolerated treatment well             History reviewed. No pertinent past medical history. History reviewed. No pertinent surgical history. There are no active problems to display for this patient.   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  Difficulty putting on sock. Saw the doctor and blood flow OK.  Was painful while in [facility] and driving to/from with lifting foot off the pedal.  States he doesn't feel steady on his feet and feels he is walking slower.  I can play golf.  Walking slower 1 mile to walk the dog  PERTINENT HISTORY: HTN; Left knee OA wears a brace Had PT at [facility] > 1 year ago for right posterior hip pain PAIN:  Are you having pain? Yes NPRS scale: 0/10 Pain location: right upper anterior thigh and groin Pain orientation: Right and Anterior  PAIN TYPE: aching and throbbing Pain description: intermittent  Aggravating factors: AM; night time,  lying on affected side, getting in/out of bed, putting on sock; lifting foot off the gas pedal/brake Relieving factors: sitting; walking is OK, Tylenol   PRECAUTIONS: None   WEIGHT BEARING RESTRICTIONS:  No  FALLS:  Has patient fallen in last 6 months? No  LIVING ENVIRONMENT: Lives with: lives with their spouse Lives in: House/apartment Stairs: Yes: Internal: 12 steps; on right going up    OCCUPATION: retired  PLOF: Independent  PATIENT GOALS: alleviate this pain in the mornings, night, easier time moving that leg   OBJECTIVE:  Note: Objective measures were completed at Evaluation unless otherwise noted.  DIAGNOSTIC FINDINGS:  Greater than 1 year ago: Feb 2024: DG HIP (WITH OR WITHOUT PELVIS) 2-3V RIGHT   COMPARISON:  None Available.   FINDINGS: No fracture or dislocation. Mild degenerative change of the left hip with joint space loss, subchondral sclerosis and osteophytosis. No evidence of avascular necrosis. Limited visualization of the pelvis is normal. Mild degenerative change of the contralateral hip left hip is suspected though incompletely evaluated. Degenerative change of the lower lumbar spine is suspected though incompletely evaluated. Surgical mesh overlies the lower abdomen and pelvis bilaterally. Regional soft tissues appear otherwise normal.   IMPRESSION: 1. No acute findings. 2. 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 LUMBAR SPINE - COMPLETE 4+ VIEW Feb 2025   COMPARISON:  None Available.   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 Grade 1 anterolisthesis of  L4 upon L5 measuring 7 mm. Minimal (4-5 mm) of retrolisthesis of L2 upon L3. No definite pars defects.   Lumbar vertebral body heights appear preserved.   Mild-to-moderate multilevel lumbar spine DDD, worse at L2-L3 and L3-L4 with disc space height loss, endplate irregularity and sclerosis.   Limited visualization of the bilateral SI joints and hips is  normal.   Regional bowel gas pattern appears normal. Surgical mesh overlies the lower abdomen/pelvis bilaterally.   IMPRESSION: 1. No acute findings. 2. Mild-to-moderate multilevel lumbar spine DDD, worse at L2-L3 and L3-L4.    Aggravating factors:  increased pain with sitting, lying on affected side, standing on single leg, rising from sit to stand  FUNCTIONAL OUTCOME MEASURE:  LEFS    61  /80  POSTURE:  static standing posture shows weight shift away from painful side  PALPATION: tenderness to palpation of proximal quads/rectus femoris  TRUNK ROM:  flexion WFLS but bil shortened HS noted, lumbar extension limited 75%; side bending WFLS  HIP PROM:  right hip flexion limited to 90 degrees; right hip internal rotation limited to 10 degrees;  hip external rotation to 35 degrees; left knee flexion contracture noted in supine 15 degrees  STRENGTH:  right hip flexion 4/5, hip abduction 3+/5; hip extension 3+/5; knee extension 4/5, knee flexion 4/5  SPECIAL TESTS: slump test negative  SLR negative, Single leg stance test   2-3   sec right/left;  unable to rise sit to stand without UE assist  FUNCTIONAL TESTS:  5X Sit to stand test  13.34 with moderate UE assist  GAIT:  decreased hip extension; decreased gait speed; hip adducted and internally rotated bil                                                                                                                                TREATMENT DATE: 01/08/24  Evaluation OA pattern: AM stiffness and pain that loosens up as the day goes on and basic treatment concepts including heat  Initial HEP  PATIENT EDUCATION:  Education details: Educated patient on anatomy and physiology of current symptoms, prognosis, plan of care as well as initial self care strategies to promote recovery Person educated: Patient Education method: Explanation Education comprehension: verbalized understanding  HOME EXERCISE PROGRAM: Access Code: [email or web address] URL:  [email or web address] Date: 01/08/2024 Prepared by: Darien Eden  Exercises - Hip Swing  - 1 x daily - 7 x weekly - 3 sets - 10 reps - Hip Flexor Stretch with Chair  - 1 x daily - 7 x weekly - 1 sets - 10 reps - Clamshell (Mirrored)  - 1 x daily - 7 x weekly - 1 sets - 10 reps ASSESSMENT:  CLINICAL IMPRESSION: Patient is a 78 y.o. male who was seen today for physical therapy evaluation and treatment for right upper thigh pain and groin pain.  Pain is worse in the AM and is also painful with right sidelying.  He is also having  difficulty getting in/out of bed, putting on his sock and with moving that leg with almost any movement in the morning times.  Decreased hip ROM noted with flexion and internal rotation more than external rotation.  Muscle weakness in right quads (proximal and distal) as well as gluteals.  Decreased hip flexor muscle length and HS length.  Decreased gait speed with lack of hip extension and extra hip adduction and internal rotation noted.  He would benefit from skilled PT to address these deficits.     OBJECTIVE IMPAIRMENTS: decreased activity tolerance, difficulty walking, decreased balance, decreased endurance, decreased mobility, decreased ROM, decreased strength, impaired flexibility, impaired LE use, postural dysfunction, and pain.  ACTIVITY LIMITATIONS: bending, lifting, carry, locomotion, cleaning, community activity, driving    PARTICIPATION LIMITATIONS: driving and community activity  PERSONAL FACTORS: Past/current experiences and 1 comorbidity: left knee OA are also affecting patient's functional outcome.   REHAB POTENTIAL: Good  CLINICAL DECISION MAKING: Stable/uncomplicated  EVALUATION COMPLEXITY: Low   GOALS: Goals reviewed with patient? Yes  SHORT TERM GOALS: Target date: 02/05/2024   The patient will demonstrate knowledge of basic self care strategies and exercises to promote healing  Baseline: Goal status: INITIAL  2.  The  patient will report a 25% improvement in pain levels with functional activities which are currently difficult including putting on his sock, getting in/out of bed, morning time mobility  Baseline:  Goal status: INITIAL  3.  Right hip flexion 105 degrees needed for putting on socks/shoes Baseline:  Goal status: INITIAL  4.  Right hip internal rotation to 20 degrees and external rotation to 40 degrees needed for grooming/dressing tasks Baseline:  Goal status: INITIAL    LONG TERM GOALS: Target date: 03/04/2024   The patient will be independent in a safe self progression of a home exercise program to promote further recovery of function  Baseline:  Goal status: INITIAL  2. The patient will report a 60% improvement in pain levels with functional activities which are currently difficult including putting on his sock, getting in/out of bed, morning time mobility, lifting his foot on/off the gas and brake pedal  Baseline:  Goal status: INITIAL  3.  The patient will have improved hip strength to at least 4+/5 needed for standing, walking longer distances, playing golf  Baseline:  Goal status: INITIAL  4.  Improved LE strength with ability to rise from standard height chair without UE assist Baseline:  Goal status: INITIAL  5.  LEFS functional outcome measure improved to    69  /80 indicating improved function with less pain Baseline:  Goal status: INITIAL    PLAN:  PT FREQUENCY: 2x/week  PT DURATION: 8 weeks  PLANNED INTERVENTIONS: [id number]- PT Re-evaluation, 97110-Therapeutic exercises, 97530- Therapeutic activity, 97112- Neuromuscular re-education, 97535- Self Care, [id number]- Manual therapy, [phone]- Aquatic Therapy, [id number]- Electrical stimulation (unattended), [phone]- Electrical stimulation (manual), [id number]- Ultrasound, [id number]- Ionotophoresis 4mg /ml Dexamethasone, Patient/Family education, Taping, Dry Needling, Joint mobilization, Cryotherapy, and Moist heat  PLAN FOR NEXT SESSION:  right proximal quads DN, hip flexor lengthening; hip joint mobilizations, hip ROM; hip strengthening; sit to stand from chair +cushion for higher seat height  Darien Eden, PT 01/08/24 4:24 PM Phone: [phone] Fax: [phone]

## 2024-01-10 ENCOUNTER — Ambulatory Visit: Admitting: Physical Therapy

## 2024-01-10 DIAGNOSIS — M6281 Muscle weakness (generalized): Secondary | ICD-10-CM | POA: Diagnosis not present

## 2024-01-10 DIAGNOSIS — R262 Difficulty in walking, not elsewhere classified: Secondary | ICD-10-CM | POA: Diagnosis not present

## 2024-01-10 DIAGNOSIS — M25651 Stiffness of right hip, not elsewhere classified: Secondary | ICD-10-CM | POA: Diagnosis not present

## 2024-01-10 DIAGNOSIS — M25551 Pain in right hip: Secondary | ICD-10-CM

## 2024-01-10 NOTE — Therapy (Signed)
 OUTPATIENT PHYSICAL THERAPY LOWER EXTREMITY PROGRESS NOTE   Patient Name: Jonathan Knight MRN: [id number] DOB:[birth year], 78 y.o., male Today's Date: 01/10/2024  END OF SESSION:  PT End of Session - 01/10/24 1012     Visit Number 2    Date for PT Re-Evaluation 03/04/24    Authorization Type BCBS Medicare    PT Start Time 1014    PT Stop Time 1059    PT Time Calculation (min) 45 min    Activity Tolerance Patient tolerated treatment well             No past medical history on file. No past surgical history on file. There are no active problems to display for this patient.   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  I did the exercises yesterday without a problem. They don't hurt.     PERTINENT HISTORY: HTN; Left knee OA wears a brace Had PT at [facility] > 1 year ago for right posterior hip pain PAIN:  Are you having pain? Yes NPRS scale: 0/10 Pain location: right upper anterior thigh and groin Pain orientation: Right and Anterior  PAIN TYPE: aching and throbbing Pain description: intermittent  Aggravating factors: AM; night time,  lying on affected side, getting in/out of bed, putting on sock; lifting foot off the gas pedal/brake Relieving factors: sitting; walking is OK, Tylenol   PRECAUTIONS: None   WEIGHT BEARING RESTRICTIONS: No  FALLS:  Has patient fallen in last 6 months? No  LIVING ENVIRONMENT: Lives with: lives with their spouse Lives in: House/apartment Stairs: Yes: Internal: 12 steps; on right going up    OCCUPATION: retired  PLOF: Independent  PATIENT GOALS: alleviate this pain in the mornings, night, easier time moving that  leg   OBJECTIVE:  Note: Objective measures were completed at Evaluation unless otherwise noted.  DIAGNOSTIC FINDINGS:  Greater than 1 year ago: Feb 2024: DG HIP (WITH OR WITHOUT PELVIS) 2-3V RIGHT   COMPARISON:  None Available.   FINDINGS: No fracture or dislocation. Mild degenerative change of the left hip with joint space loss, subchondral sclerosis and osteophytosis. No evidence of avascular necrosis. Limited visualization of the pelvis is normal. Mild degenerative change of the contralateral hip left hip is suspected though incompletely evaluated. Degenerative change of the lower lumbar spine is suspected though incompletely evaluated. Surgical mesh overlies the lower abdomen and pelvis bilaterally. Regional soft tissues appear otherwise normal.   IMPRESSION: 1. No acute findings. 2. 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 LUMBAR SPINE - COMPLETE 4+ VIEW Feb 2025   COMPARISON:  None Available.   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 Grade 1 anterolisthesis of L4 upon L5 measuring 7 mm. Minimal (4-5 mm) of retrolisthesis of L2 upon L3. No definite pars defects.   Lumbar vertebral body heights appear preserved.   Mild-to-moderate multilevel lumbar spine DDD, worse at L2-L3 and L3-L4 with disc space height loss, endplate irregularity and sclerosis.   Limited visualization of the bilateral SI joints and  hips is normal.   Regional bowel gas pattern appears normal. Surgical mesh overlies the lower abdomen/pelvis bilaterally.   IMPRESSION: 1. No acute findings. 2. Mild-to-moderate multilevel lumbar spine DDD, worse at L2-L3 and L3-L4.    Aggravating factors:  increased pain with sitting, lying on affected side, standing on  single leg, rising from sit to stand  FUNCTIONAL OUTCOME MEASURE:  LEFS    61  /80  POSTURE:  static standing posture shows weight shift away from painful side  PALPATION: tenderness to palpation of proximal quads/rectus femoris  TRUNK ROM:  flexion WFLS but bil shortened HS noted, lumbar extension limited 75%; side bending WFLS  HIP PROM:  right hip flexion limited to 90 degrees; right hip internal rotation limited to 10 degrees;  hip external rotation to 35 degrees; left knee flexion contracture noted in supine 15 degrees  STRENGTH:  right hip flexion 4/5, hip abduction 3+/5; hip extension 3+/5; knee extension 4/5, knee flexion 4/5  SPECIAL TESTS: slump test negative  SLR negative, Single leg stance test   2-3   sec right/left;  unable to rise sit to stand without UE assist  FUNCTIONAL TESTS:  5X Sit to stand test  13.34 with moderate UE assist  GAIT:  decreased hip extension; decreased gait speed; hip adducted and internally rotated bil                                                                                                                                TREATMENT DATE:  01/10/24 Review of initial HEP At the stairs 2nd step hip flexor, gastroc and quadratus lumborum muscle lengthening 3 sets of 5 right/left: Rocking forward and back Rocking forward and back with arm elevation Rocking forward and back with arm reach up and over On the first step leg swing with 5# ankle weight 20x WB on right with left hip flexion to 90 degrees with 5 sec hold (added to HEP) UE support on counter 10x WB on right with modified RDL to pick up pen from stool height (added to HEP)  10x Trigger Point Dry Needling  Initial Treatment: Pt instructed on Dry Needling rational, procedures, and possible side effects. Pt instructed to expect mild to moderate muscle soreness later in the day and/or into the next day.  Pt instructed in methods to reduce muscle soreness. Pt instructed to continue  prescribed HEP. Patient verbalized understanding of these instructions and education.   Patient Verbal Consent Given: Yes Education Handout Provided: Yes Muscles Treated: right vastus medialis, adductor longus/brevis marination, no pistoning Electrical Stimulation Performed: No Treatment Response/Outcome: improved soft tissue mobility    01/08/24  Evaluation OA pattern: AM stiffness and pain that loosens up as the day goes on and basic treatment concepts including heat  Initial HEP  PATIENT EDUCATION:  Education details: Educated patient on anatomy and physiology of current symptoms, prognosis, plan of care as well as initial self care strategies to promote recovery  Person educated: Patient Education method: Explanation Education comprehension: verbalized understanding  HOME EXERCISE PROGRAM: Access Code: [email or web address] URL: [email or web address] Date: 01/10/2024 Prepared by: Darien Eden  Exercises - Hip Swing  - 1 x daily - 7 x weekly - 3 sets - 10 reps - Hip Flexor Stretch with Chair  - 1 x daily - 7 x weekly - 1 sets - 10 reps - Clamshell (Mirrored)  - 1 x daily - 7 x weekly - 1 sets - 10 reps - Standing Marching (Mirrored)  - 1 x daily - 7 x weekly - 1 sets - 10 reps - 5 hold - Forward T with Counter Support (Mirrored)  - 1 x daily - 7 x weekly - 1 sets - 10 reps ASSESSMENT:  CLINICAL IMPRESSION: Therapist progressing and updating HEP for increased intensity and challenge level for further strengthening and functional mobility.   The patient had a positive initial response to DN with much improved soft tissue mobility and decreased size and number of tender points.  Therapist monitoring response and educating patient on what to expect following DN and how to optimize benefit with specific exercise.  Anticipate pain intensity and mobility will continue to improve over the next few days.        Eval: Patient is a 78 y.o. male who was seen today for physical therapy  evaluation and treatment for right upper thigh pain and groin pain.  Pain is worse in the AM and is also painful with right sidelying.  He is also having difficulty getting in/out of bed, putting on his sock and with moving that leg with almost any movement in the morning times.  Decreased hip ROM noted with flexion and internal rotation more than external rotation.  Muscle weakness in right quads (proximal and distal) as well as gluteals.  Decreased hip flexor muscle length and HS length.  Decreased gait speed with lack of hip extension and extra hip adduction and internal rotation noted.  He would benefit from skilled PT to address these deficits.     OBJECTIVE IMPAIRMENTS: decreased activity tolerance, difficulty walking, decreased balance, decreased endurance, decreased mobility, decreased ROM, decreased strength, impaired flexibility, impaired LE use, postural dysfunction, and pain.  ACTIVITY LIMITATIONS: bending, lifting, carry, locomotion, cleaning, community activity, driving    PARTICIPATION LIMITATIONS: driving and community activity  PERSONAL FACTORS: Past/current experiences and 1 comorbidity: left knee OA are also affecting patient's functional outcome.   REHAB POTENTIAL: Good  CLINICAL DECISION MAKING: Stable/uncomplicated  EVALUATION COMPLEXITY: Low   GOALS: Goals reviewed with patient? Yes  SHORT TERM GOALS: Target date: 02/05/2024   The patient will demonstrate knowledge of basic self care strategies and exercises to promote healing  Baseline: Goal status: INITIAL  2.  The patient will report a 25% improvement in pain levels with functional activities which are currently difficult including putting on his sock, getting in/out of bed, morning time mobility  Baseline:  Goal status: INITIAL  3.  Right hip flexion 105 degrees needed for putting on socks/shoes Baseline:  Goal status: INITIAL  4.  Right hip internal rotation to 20 degrees and external rotation to 40  degrees needed for grooming/dressing tasks Baseline:  Goal status: INITIAL    LONG TERM GOALS: Target date: 03/04/2024   The patient will be independent in a safe self progression of a home exercise program to promote further recovery of function  Baseline:  Goal status: INITIAL  2. The patient will report a 60% improvement in pain levels with functional  activities which are currently difficult including putting on his sock, getting in/out of bed, morning time mobility, lifting his foot on/off the gas and brake pedal  Baseline:  Goal status: INITIAL  3.  The patient will have improved hip strength to at least 4+/5 needed for standing, walking longer distances, playing golf  Baseline:  Goal status: INITIAL  4.  Improved LE strength with ability to rise from standard height chair without UE assist Baseline:  Goal status: INITIAL  5.  LEFS functional outcome measure improved to    69  /80 indicating improved function with less pain Baseline:  Goal status: INITIAL    PLAN:  PT FREQUENCY: 2x/week  PT DURATION: 8 weeks  PLANNED INTERVENTIONS: 97164- PT Re-evaluation, 97110-Therapeutic exercises, 97530- Therapeutic activity, 97112- Neuromuscular re-education, 97535- Self Care, 16109- Manual therapy, [id number]- Aquatic Therapy, G0283- Electrical stimulation (unattended), [id number]- Electrical stimulation (manual), [id number]- Ultrasound, 91478- Ionotophoresis 4mg /ml Dexamethasone, Patient/Family education, Taping, Dry Needling, Joint mobilization, Cryotherapy, and Moist heat  PLAN FOR NEXT SESSION: assess response to right proximal thigh DN, add supine hip flexor lengthening; hip joint mobilizations, hip ROM; hip strengthening; sit to stand from chair +cushion for higher seat height  Darien Eden, PT 01/10/24 10:56 AM Phone: [phone] Fax: [phone]

## 2024-01-10 NOTE — Patient Instructions (Signed)

## 2024-01-15 ENCOUNTER — Ambulatory Visit: Admitting: Physical Therapy

## 2024-01-15 DIAGNOSIS — M25551 Pain in right hip: Secondary | ICD-10-CM | POA: Diagnosis not present

## 2024-01-15 DIAGNOSIS — R262 Difficulty in walking, not elsewhere classified: Secondary | ICD-10-CM | POA: Diagnosis not present

## 2024-01-15 DIAGNOSIS — M6281 Muscle weakness (generalized): Secondary | ICD-10-CM

## 2024-01-15 DIAGNOSIS — M25651 Stiffness of right hip, not elsewhere classified: Secondary | ICD-10-CM

## 2024-01-15 NOTE — Therapy (Signed)
 OUTPATIENT PHYSICAL THERAPY LOWER EXTREMITY PROGRESS NOTE   Patient Name: Jonathan Knight MRN: [id number] DOB:07-Dec-1945, 78 y.o., male Today's Date: 01/15/2024  END OF SESSION:  PT End of Session - 01/15/24 1447     Visit Number 3    Date for PT Re-Evaluation 03/04/24    Authorization Type BCBS Medicare    PT Start Time 1447    PT Stop Time 1529    PT Time Calculation (min) 42 min    Activity Tolerance Patient tolerated treatment well             No past medical history on file. No past surgical history on file. There are no active problems to display for this patient.   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  Better in/out of bed.  When driving with foot on pedal and taking off feels it mid thigh.  Had soreness in shoulders and back.     PERTINENT HISTORY: HTN; Left knee OA wears a brace Had PT at [facility] > 1 year ago for right posterior hip pain PAIN:  Are you having pain? Yes NPRS scale: 0/10 Pain location: right upper anterior thigh and groin Pain orientation: Right and Anterior  PAIN TYPE: aching and throbbing Pain description: intermittent  Aggravating factors: AM; night time,  lying on affected side, getting in/out of bed, putting on sock; lifting foot off the gas pedal/brake Relieving factors: sitting; walking is OK, Tylenol   PRECAUTIONS: None   WEIGHT BEARING RESTRICTIONS: No  FALLS:  Has patient fallen in last 6 months? No  LIVING ENVIRONMENT: Lives with: lives with their spouse Lives in: House/apartment Stairs: Yes: Internal: 12 steps; on right going up    OCCUPATION: retired  PLOF: Independent  PATIENT GOALS:  alleviate this pain in the mornings, night, easier time moving that leg   OBJECTIVE:  Note: Objective measures were completed at Evaluation unless otherwise noted.  DIAGNOSTIC FINDINGS:  Greater than 1 year ago: Feb 2024: DG HIP (WITH OR WITHOUT PELVIS) 2-3V RIGHT   COMPARISON:  None Available.   FINDINGS: No fracture or dislocation. Mild degenerative change of the left hip with joint space loss, subchondral sclerosis and osteophytosis. No evidence of avascular necrosis. Limited visualization of the pelvis is normal. Mild degenerative change of the contralateral hip left hip is suspected though incompletely evaluated. Degenerative change of the lower lumbar spine is suspected though incompletely evaluated. Surgical mesh overlies the lower abdomen and pelvis bilaterally. Regional soft tissues appear otherwise normal.   IMPRESSION: 1. No acute findings. 2. 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 LUMBAR SPINE - COMPLETE 4+ VIEW Feb 2025   COMPARISON:  None Available.   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 Grade 1 anterolisthesis of L4 upon L5 measuring 7 mm. Minimal (4-5 mm) of retrolisthesis of L2 upon L3. No definite pars defects.   Lumbar vertebral body heights appear preserved.   Mild-to-moderate multilevel lumbar spine DDD, worse at L2-L3 and L3-L4 with disc space  height loss, endplate irregularity and sclerosis.   Limited visualization of the bilateral SI joints and hips is normal.   Regional bowel gas pattern appears normal. Surgical mesh overlies the lower abdomen/pelvis bilaterally.   IMPRESSION: 1. No acute findings. 2. Mild-to-moderate multilevel lumbar spine DDD, worse at L2-L3 and L3-L4.    Aggravating  factors:  increased pain with sitting, lying on affected side, standing on single leg, rising from sit to stand  FUNCTIONAL OUTCOME MEASURE:  LEFS    61  /80  POSTURE:  static standing posture shows weight shift away from painful side  PALPATION: tenderness to palpation of proximal quads/rectus femoris  TRUNK ROM:  flexion WFLS but bil shortened HS noted, lumbar extension limited 75%; side bending WFLS  HIP PROM:  right hip flexion limited to 90 degrees; right hip internal rotation limited to 10 degrees;  hip external rotation to 35 degrees; left knee flexion contracture noted in supine 15 degrees  STRENGTH:  right hip flexion 4/5, hip abduction 3+/5; hip extension 3+/5; knee extension 4/5, knee flexion 4/5  SPECIAL TESTS: slump test negative  SLR negative, Single leg stance test   2-3   sec right/left;  unable to rise sit to stand without UE assist  FUNCTIONAL TESTS:  5X Sit to stand test  13.34 with moderate UE assist  GAIT:  decreased hip extension; decreased gait speed; hip adducted and internally rotated bil                                                                                                                                TREATMENT DATE:  01/15/24 Status update and discussion of treatment plan; education on expected timeframe for recovery Doorway hip flexor stretch with arm slide up and reach over 10x WB on right with left hip flexion to 90 degrees holding 10# with light UE support on counter 10x WB on right with single leg modified RDL to stool height 10#   light UE support on counter 10x Supine hip flexor stretch off side of the bed 30 sec 3x (added to HEP) Trigger Point Dry Needling subsequentTreatment: Pt instructed on Dry Needling rational, procedures, and possible side effects. Pt instructed to expect mild to moderate muscle soreness later in the day and/or into the next day.  Pt instructed in methods to reduce muscle soreness. Pt instructed to continue prescribed  HEP. Patient verbalized understanding of these instructions and education.  Patient Verbal Consent Given: Yes Education Handout Provided: Yes Muscles Treated: right vastus medialis, right ITB, right glute in sidelying position with pillow b/w knees Electrical Stimulation Performed: yes frequency 80, 1.5 ma 8 Treatment Response/Outcome: improved soft tissue mobility  01/10/24 Review of initial HEP At the stairs 2nd step hip flexor, gastroc and quadratus lumborum muscle lengthening 3 sets of 5 right/left: Rocking forward and back Rocking forward and back with arm elevation Rocking forward and back with arm reach up and over On the first step  leg swing with 5# ankle weight 20x WB on right with left hip flexion to 90 degrees with 5 sec hold (added to HEP) UE support on counter 10x WB on right with modified RDL to pick up pen from stool height (added to HEP)  10x Trigger Point Dry Needling  Initial Treatment: Pt instructed on Dry Needling rational, procedures, and possible side effects. Pt instructed to expect mild to moderate muscle soreness later in the day and/or into the next day.  Pt instructed in methods to reduce muscle soreness. Pt instructed to continue prescribed HEP. Patient verbalized understanding of these instructions and education.   Patient Verbal Consent Given: Yes Education Handout Provided: Yes Muscles Treated: right vastus medialis, adductor longus/brevis marination, no pistoning Electrical Stimulation Performed: No Treatment Response/Outcome: improved soft tissue mobility    01/08/24  Evaluation OA pattern: AM stiffness and pain that loosens up as the day goes on and basic treatment concepts including heat  Initial HEP  PATIENT EDUCATION:  Education details: Educated patient on anatomy and physiology of current symptoms, prognosis, plan of care as well as initial self care strategies to promote recovery Person educated: Patient Education method:  Explanation Education comprehension: verbalized understanding  HOME EXERCISE PROGRAM: Access Code: [email or web address] URL: [email or web address] Date: 01/10/2024 Prepared by: Darien Eden  Exercises - Hip Swing  - 1 x daily - 7 x weekly - 3 sets - 10 reps - Hip Flexor Stretch with Chair  - 1 x daily - 7 x weekly - 1 sets - 10 reps - Clamshell (Mirrored)  - 1 x daily - 7 x weekly - 1 sets - 10 reps - Standing Marching (Mirrored)  - 1 x daily - 7 x weekly - 1 sets - 10 reps - 5 hold - Forward T with Counter Support (Mirrored)  - 1 x daily - 7 x weekly - 1 sets - 10 reps ASSESSMENT:  CLINICAL IMPRESSION: Able to add resistance (10# weight) to standing isometrics (weightbearing on right only).  Avoided right hip flexion exercise secondary to the onset of pain with this motion.  Electrical stimulation added to indwelling needles for further impacts on pain relief and neuromuscular changes as well as skeletal muscle pump.  Therapist providing verbal cues to optimize technique with exercises in order to achieve the greatest benefit.      Eval: Patient is a 78 y.o. male who was seen today for physical therapy evaluation and treatment for right upper thigh pain and groin pain.  Pain is worse in the AM and is also painful with right sidelying.  He is also having difficulty getting in/out of bed, putting on his sock and with moving that leg with almost any movement in the morning times.  Decreased hip ROM noted with flexion and internal rotation more than external rotation.  Muscle weakness in right quads (proximal and distal) as well as gluteals.  Decreased hip flexor muscle length and HS length.  Decreased gait speed with lack of hip extension and extra hip adduction and internal rotation noted.  He would benefit from skilled PT to address these deficits.     OBJECTIVE IMPAIRMENTS: decreased activity tolerance, difficulty walking, decreased balance, decreased endurance, decreased mobility,  decreased ROM, decreased strength, impaired flexibility, impaired LE use, postural dysfunction, and pain.  ACTIVITY LIMITATIONS: bending, lifting, carry, locomotion, cleaning, community activity, driving    PARTICIPATION LIMITATIONS: driving and community activity  PERSONAL FACTORS: Past/current experiences and 1 comorbidity: left knee OA are also affecting patient's functional outcome.  REHAB POTENTIAL: Good  CLINICAL DECISION MAKING: Stable/uncomplicated  EVALUATION COMPLEXITY: Low   GOALS: Goals reviewed with patient? Yes  SHORT TERM GOALS: Target date: 02/05/2024   The patient will demonstrate knowledge of basic self care strategies and exercises to promote healing  Baseline: Goal status: INITIAL  2.  The patient will report a 25% improvement in pain levels with functional activities which are currently difficult including putting on his sock, getting in/out of bed, morning time mobility  Baseline:  Goal status: INITIAL  3.  Right hip flexion 105 degrees needed for putting on socks/shoes Baseline:  Goal status: INITIAL  4.  Right hip internal rotation to 20 degrees and external rotation to 40 degrees needed for grooming/dressing tasks Baseline:  Goal status: INITIAL    LONG TERM GOALS: Target date: 03/04/2024   The patient will be independent in a safe self progression of a home exercise program to promote further recovery of function  Baseline:  Goal status: INITIAL  2. The patient will report a 60% improvement in pain levels with functional activities which are currently difficult including putting on his sock, getting in/out of bed, morning time mobility, lifting his foot on/off the gas and brake pedal  Baseline:  Goal status: INITIAL  3.  The patient will have improved hip strength to at least 4+/5 needed for standing, walking longer distances, playing golf  Baseline:  Goal status: INITIAL  4.  Improved LE strength with ability to rise from standard  height chair without UE assist Baseline:  Goal status: INITIAL  5.  LEFS functional outcome measure improved to    69  /80 indicating improved function with less pain Baseline:  Goal status: INITIAL    PLAN:  PT FREQUENCY: 2x/week  PT DURATION: 8 weeks  PLANNED INTERVENTIONS: 97164- PT Re-evaluation, 97110-Therapeutic exercises, 97530- Therapeutic activity, 97112- Neuromuscular re-education, 97535- Self Care, 16109- Manual therapy, [phone]- Aquatic Therapy, G0283- Electrical stimulation (unattended), [phone]- Electrical stimulation (manual), 97035- Ultrasound, 91478- Ionotophoresis 4mg /ml Dexamethasone, Patient/Family education, Taping, Dry Needling, Joint mobilization, Cryotherapy, and Moist heat  PLAN FOR NEXT SESSION: assess response to right proximal thigh, glute and ITB DN combined with ES, hip flexor lengthening;   hip strengthening particularly glutes; sit to stand from chair +cushion for higher seat height;  has 10# weight at home  Darien Eden, PT 01/15/24 7:18 PM Phone: [phone] Fax: [phone]

## 2024-01-17 ENCOUNTER — Ambulatory Visit: Admitting: Rehabilitative and Restorative Service Providers"

## 2024-01-21 DIAGNOSIS — M791 Myalgia, unspecified site: Secondary | ICD-10-CM | POA: Diagnosis not present

## 2024-01-21 DIAGNOSIS — M2550 Pain in unspecified joint: Secondary | ICD-10-CM | POA: Diagnosis not present

## 2024-01-23 ENCOUNTER — Ambulatory Visit: Admitting: Rehabilitative and Restorative Service Providers"

## 2024-01-23 ENCOUNTER — Ambulatory Visit: Payer: Self-pay | Admitting: Physical Therapy

## 2024-01-23 DIAGNOSIS — R262 Difficulty in walking, not elsewhere classified: Secondary | ICD-10-CM | POA: Diagnosis not present

## 2024-01-23 DIAGNOSIS — M25651 Stiffness of right hip, not elsewhere classified: Secondary | ICD-10-CM | POA: Diagnosis not present

## 2024-01-23 DIAGNOSIS — M25551 Pain in right hip: Secondary | ICD-10-CM

## 2024-01-23 DIAGNOSIS — M6281 Muscle weakness (generalized): Secondary | ICD-10-CM | POA: Diagnosis not present

## 2024-01-23 NOTE — Therapy (Signed)
 OUTPATIENT PHYSICAL THERAPY LOWER EXTREMITY PROGRESS NOTE   Patient Name: Jonathan Knight MRN: [id number] DOB:10-22-1945, 78 y.o., male Today's Date: 01/23/2024  END OF SESSION:  PT End of Session - 01/23/24 1015     Visit Number 4    Date for PT Re-Evaluation 03/04/24    Authorization Type BCBS Medicare    PT Start Time 1015    PT Stop Time 1057    PT Time Calculation (min) 42 min    Activity Tolerance Patient tolerated treatment well             No past medical history on file. No past surgical history on file. There are no active problems to display for this patient.   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  I made an appt at the doctor and she did bloodwork.  I have inflammation in 2 of the categories.  She started me on an antibiotic for 30 days.  Tuesday night I didn't have any pain and last night didn't have any pain.  Some minimal pain when I drive and lift the right hip.  Some pain when I bend down but this is a big improvement!  I'm going to try to play golf tomorrow.    PERTINENT HISTORY: HTN; Left knee OA wears a brace Had PT at [facility] > 1 year ago for right posterior hip pain PAIN:  Are you having pain? Yes NPRS scale: 0/10 Pain location: right upper anterior thigh and groin Pain orientation: Right and Anterior  PAIN TYPE: aching and throbbing Pain description: intermittent  Aggravating factors: AM; night time,  lying on affected side, getting in/out of bed, putting on sock; lifting foot off the gas pedal/brake Relieving factors: sitting; walking is OK, Tylenol   PRECAUTIONS: None   WEIGHT BEARING  RESTRICTIONS: No  FALLS:  Has patient fallen in last 6 months? No  LIVING ENVIRONMENT: Lives with: lives with their spouse Lives in: House/apartment Stairs: Yes: Internal: 12 steps; on right going up    OCCUPATION: retired  PLOF: Independent  PATIENT GOALS: alleviate this pain in the mornings, night, easier time moving that leg   OBJECTIVE:  Note: Objective measures were completed at Evaluation unless otherwise noted.  DIAGNOSTIC FINDINGS:  Greater than 1 year ago: Feb 2024: DG HIP (WITH OR WITHOUT PELVIS) 2-3V RIGHT   COMPARISON:  None Available.   FINDINGS: No fracture or dislocation. Mild degenerative change of the left hip with joint space loss, subchondral sclerosis and osteophytosis. No evidence of avascular necrosis. Limited visualization of the pelvis is normal. Mild degenerative change of the contralateral hip left hip is suspected though incompletely evaluated. Degenerative change of the lower lumbar spine is suspected though incompletely evaluated. Surgical mesh overlies the lower abdomen and pelvis bilaterally. Regional soft tissues appear otherwise normal.   IMPRESSION: 1. No acute findings. 2. 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 LUMBAR SPINE - COMPLETE 4+ VIEW Feb 2025   COMPARISON:  None Available.   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 Grade 1 anterolisthesis of L4 upon L5 measuring 7 mm. Minimal (4-5 mm) of retrolisthesis of L2 upon L3. No definite pars defects.   Lumbar vertebral body heights appear preserved.   Mild-to-moderate multilevel lumbar spine DDD, worse at L2-L3 and L3-L4 with disc space height loss, endplate irregularity and sclerosis.   Limited visualization of the bilateral SI  joints and hips is normal.   Regional bowel gas pattern appears normal. Surgical mesh overlies the lower abdomen/pelvis bilaterally.   IMPRESSION: 1. No acute findings. 2. Mild-to-moderate multilevel lumbar spine DDD, worse at L2-L3 and L3-L4.    Aggravating factors:  increased pain with sitting, lying on affected side, standing on single leg, rising from sit to stand  FUNCTIONAL OUTCOME MEASURE:  LEFS    61  /80  POSTURE:  static standing posture shows weight shift away from painful side  PALPATION: tenderness to palpation of proximal quads/rectus femoris  TRUNK ROM:  flexion WFLS but bil shortened HS noted, lumbar extension limited 75%; side bending WFLS  HIP PROM:  right hip flexion limited to 90 degrees; right hip internal rotation limited to 10 degrees;  hip external rotation to 35 degrees; left knee flexion contracture noted in supine 15 degrees  STRENGTH:  right hip flexion 4/5, hip abduction 3+/5; hip extension 3+/5; knee extension 4/5, knee flexion 4/5  SPECIAL TESTS: slump test negative  SLR negative, Single leg stance test   2-3   sec right/left;  unable to rise sit to stand without UE assist  FUNCTIONAL TESTS:  5X Sit to stand test  13.34 with moderate UE assist  GAIT:  decreased hip extension; decreased gait speed; hip adducted and internally rotated bil                                                                                                                                TREATMENT DATE:  01/23/24 Supine hip flexor stretch off side of the bed 30 sec 3x  Lumbar rotation supine 10x  Butterfly stretch 10x Bridge 10x Supine green band clams 20x Supine HS sets green ball 15x Supine with heels on green ball frog leg position 20x Trigger Point Dry Needling subsequentTreatment: Pt instructed on Dry Needling rational, procedures, and possible side effects. Pt instructed to expect mild to moderate muscle soreness later in the day and/or into the next day.  Pt  instructed in methods to reduce muscle soreness. Pt instructed to continue prescribed HEP. Patient verbalized understanding of these instructions and education.  Patient Verbal Consent Given: Yes Education Handout Provided: Yes Muscles Treated: right adductor brevis, longus, gracilis; no e-stim (supine position with slight external rotation, knee bent leg resting against bolster)  Treatment Response/Outcome: improved soft tissue mobility  01/15/24 Status update and discussion of treatment plan; education on expected timeframe for recovery Doorway hip flexor stretch with arm slide up and reach over 10x  WB on right with left hip flexion to 90 degrees holding 10# with light UE support on counter 10x WB on right with single leg modified RDL to stool height 10#   light UE support on counter 10x Supine hip flexor stretch off side of the bed 30 sec 3x (added to HEP) Trigger Point Dry Needling subsequentTreatment: Pt instructed on Dry Needling rational, procedures, and possible side effects. Pt instructed to expect mild to moderate muscle soreness later in the day and/or into the next day.  Pt instructed in methods to reduce muscle soreness. Pt instructed to continue prescribed HEP. Patient verbalized understanding of these instructions and education.  Patient Verbal Consent Given: Yes Education Handout Provided: Yes Muscles Treated: right vastus medialis, right ITB, right glute in sidelying position with pillow b/w knees Electrical Stimulation Performed: yes frequency 80, 1.5 ma 8 Treatment Response/Outcome: improved soft tissue mobility  01/10/24 Review of initial HEP At the stairs 2nd step hip flexor, gastroc and quadratus lumborum muscle lengthening 3 sets of 5 right/left: Rocking forward and back Rocking forward and back with arm elevation Rocking forward and back with arm reach up and over On the first step leg swing with 5# ankle weight 20x WB on right with left hip flexion to 90 degrees  with 5 sec hold (added to HEP) UE support on counter 10x WB on right with modified RDL to pick up pen from stool height (added to HEP)  10x Trigger Point Dry Needling  Initial Treatment: Pt instructed on Dry Needling rational, procedures, and possible side effects. Pt instructed to expect mild to moderate muscle soreness later in the day and/or into the next day.  Pt instructed in methods to reduce muscle soreness. Pt instructed to continue prescribed HEP. Patient verbalized understanding of these instructions and education.   Patient Verbal Consent Given: Yes Education Handout Provided: Yes Muscles Treated: right vastus medialis, adductor longus/brevis marination, no pistoning Electrical Stimulation Performed: No Treatment Response/Outcome: improved soft tissue mobility    PATIENT EDUCATION:  Education details: Educated patient on anatomy and physiology of current symptoms, prognosis, plan of care as well as initial self care strategies to promote recovery Person educated: Patient Education method: Explanation Education comprehension: verbalized understanding  HOME EXERCISE PROGRAM: Access Code: [email or web address] URL: [email or web address] Date: 01/10/2024 Prepared by: Darien Eden  Exercises - Hip Swing  - 1 x daily - 7 x weekly - 3 sets - 10 reps - Hip Flexor Stretch with Chair  - 1 x daily - 7 x weekly - 1 sets - 10 reps - Clamshell (Mirrored)  - 1 x daily - 7 x weekly - 1 sets - 10 reps - Standing Marching (Mirrored)  - 1 x daily - 7 x weekly - 1 sets - 10 reps - 5 hold - Forward T with Counter Support (Mirrored)  - 1 x daily - 7 x weekly - 1 sets - 10 reps ASSESSMENT:  CLINICAL IMPRESSION: The patient started on an antibiotic and secondary to inflammatory markers in his bloodwork.  He reports his pain level has been better and he felt like he could walk the dog today.  He is pleased that he was able to perform exercises today without the pain.  He responds well to DN  of hip adductor muscles.   Therapist monitoring response to all interventions and modifying treatment accordingly.  If he continues to do well functionally at home he may call to decrease treatment frequency.     Eval: Patient is a 78 y.o.  male who was seen today for physical therapy evaluation and treatment for right upper thigh pain and groin pain.  Pain is worse in the AM and is also painful with right sidelying.  He is also having difficulty getting in/out of bed, putting on his sock and with moving that leg with almost any movement in the morning times.  Decreased hip ROM noted with flexion and internal rotation more than external rotation.  Muscle weakness in right quads (proximal and distal) as well as gluteals.  Decreased hip flexor muscle length and HS length.  Decreased gait speed with lack of hip extension and extra hip adduction and internal rotation noted.  He would benefit from skilled PT to address these deficits.     OBJECTIVE IMPAIRMENTS: decreased activity tolerance, difficulty walking, decreased balance, decreased endurance, decreased mobility, decreased ROM, decreased strength, impaired flexibility, impaired LE use, postural dysfunction, and pain.  ACTIVITY LIMITATIONS: bending, lifting, carry, locomotion, cleaning, community activity, driving    PARTICIPATION LIMITATIONS: driving and community activity  PERSONAL FACTORS: Past/current experiences and 1 comorbidity: left knee OA are also affecting patient's functional outcome.   REHAB POTENTIAL: Good  CLINICAL DECISION MAKING: Stable/uncomplicated  EVALUATION COMPLEXITY: Low   GOALS: Goals reviewed with patient? Yes  SHORT TERM GOALS: Target date: 02/05/2024   The patient will demonstrate knowledge of basic self care strategies and exercises to promote healing  Baseline: Goal status: INITIAL  2.  The patient will report a 25% improvement in pain levels with functional activities which are currently difficult  including putting on his sock, getting in/out of bed, morning time mobility  Baseline:  Goal status: INITIAL  3.  Right hip flexion 105 degrees needed for putting on socks/shoes Baseline:  Goal status: INITIAL  4.  Right hip internal rotation to 20 degrees and external rotation to 40 degrees needed for grooming/dressing tasks Baseline:  Goal status: INITIAL    LONG TERM GOALS: Target date: 03/04/2024   The patient will be independent in a safe self progression of a home exercise program to promote further recovery of function  Baseline:  Goal status: INITIAL  2. The patient will report a 60% improvement in pain levels with functional activities which are currently difficult including putting on his sock, getting in/out of bed, morning time mobility, lifting his foot on/off the gas and brake pedal  Baseline:  Goal status: INITIAL  3.  The patient will have improved hip strength to at least 4+/5 needed for standing, walking longer distances, playing golf  Baseline:  Goal status: INITIAL  4.  Improved LE strength with ability to rise from standard height chair without UE assist Baseline:  Goal status: INITIAL  5.  LEFS functional outcome measure improved to    69  /80 indicating improved function with less pain Baseline:  Goal status: INITIAL    PLAN:  PT FREQUENCY: 2x/week  PT DURATION: 8 weeks  PLANNED INTERVENTIONS: 97164- PT Re-evaluation, 97110-Therapeutic exercises, 97530- Therapeutic activity, 97112- Neuromuscular re-education, 97535- Self Care, 16109- Manual therapy, [phone]- Aquatic Therapy, [id number]- Electrical stimulation (unattended), [phone]- Electrical stimulation (manual), [id number]- Ultrasound, 91478- Ionotophoresis 4mg /ml Dexamethasone, Patient/Family education, Taping, Dry Needling, Joint mobilization, Cryotherapy, and Moist heat  PLAN FOR NEXT SESSION: pt may call to adjust treatment frequency if continues to feel better; assess response to DN right hip adductors,  check STGs;  hip flexor lengthening;   hip strengthening particularly glutes; sit to stand from chair +cushion for higher seat height;  has 10# weight at home  [place]  Angy Swearengin, PT 01/23/24 6:43 PM Phone: [phone] Fax: [phone]

## 2024-01-28 ENCOUNTER — Encounter: Admitting: Physical Therapy

## 2024-01-28 DIAGNOSIS — H26493 Other secondary cataract, bilateral: Secondary | ICD-10-CM | POA: Diagnosis not present

## 2024-01-28 DIAGNOSIS — H353131 Nonexudative age-related macular degeneration, bilateral, early dry stage: Secondary | ICD-10-CM | POA: Diagnosis not present

## 2024-01-28 DIAGNOSIS — H18413 Arcus senilis, bilateral: Secondary | ICD-10-CM | POA: Diagnosis not present

## 2024-01-28 DIAGNOSIS — H26491 Other secondary cataract, right eye: Secondary | ICD-10-CM | POA: Diagnosis not present

## 2024-01-28 DIAGNOSIS — Z961 Presence of intraocular lens: Secondary | ICD-10-CM | POA: Diagnosis not present

## 2024-01-29 ENCOUNTER — Ambulatory Visit: Admitting: Physical Therapy

## 2024-01-31 ENCOUNTER — Ambulatory Visit: Admitting: Rehabilitative and Restorative Service Providers"

## 2024-02-03 ENCOUNTER — Ambulatory Visit: Admitting: Rehabilitative and Restorative Service Providers"

## 2024-02-04 DIAGNOSIS — Z961 Presence of intraocular lens: Secondary | ICD-10-CM | POA: Diagnosis not present

## 2024-02-04 DIAGNOSIS — H2511 Age-related nuclear cataract, right eye: Secondary | ICD-10-CM | POA: Diagnosis not present

## 2024-02-05 ENCOUNTER — Ambulatory Visit: Admitting: Physical Therapy

## 2024-02-11 ENCOUNTER — Encounter: Admitting: Physical Therapy

## 2024-02-13 ENCOUNTER — Encounter: Admitting: Physical Therapy

## 2024-02-18 ENCOUNTER — Ambulatory Visit: Admitting: Physical Therapy

## 2024-02-21 ENCOUNTER — Encounter: Admitting: Physical Therapy

## 2024-02-25 ENCOUNTER — Encounter: Admitting: Rehabilitative and Restorative Service Providers"

## 2024-02-25 DIAGNOSIS — R52 Pain, unspecified: Secondary | ICD-10-CM | POA: Diagnosis not present

## 2024-02-25 DIAGNOSIS — R634 Abnormal weight loss: Secondary | ICD-10-CM | POA: Diagnosis not present

## 2024-02-27 ENCOUNTER — Encounter: Admitting: Physical Therapy

## 2024-03-19 DIAGNOSIS — M7918 Myalgia, other site: Secondary | ICD-10-CM | POA: Diagnosis not present

## 2024-03-19 DIAGNOSIS — M353 Polymyalgia rheumatica: Secondary | ICD-10-CM | POA: Diagnosis not present

## 2024-03-19 DIAGNOSIS — M2560 Stiffness of unspecified joint, not elsewhere classified: Secondary | ICD-10-CM | POA: Diagnosis not present

## 2024-03-19 DIAGNOSIS — Z79899 Other long term (current) drug therapy: Secondary | ICD-10-CM | POA: Diagnosis not present

## 2024-03-22 ENCOUNTER — Other Ambulatory Visit (HOSPITAL_BASED_OUTPATIENT_CLINIC_OR_DEPARTMENT_OTHER): Payer: Self-pay | Admitting: Rheumatology

## 2024-03-22 DIAGNOSIS — M353 Polymyalgia rheumatica: Secondary | ICD-10-CM

## 2024-04-06 DIAGNOSIS — M1712 Unilateral primary osteoarthritis, left knee: Secondary | ICD-10-CM | POA: Diagnosis not present

## 2024-04-14 DIAGNOSIS — K080 Exfoliation of teeth due to systemic causes: Secondary | ICD-10-CM | POA: Diagnosis not present

## 2024-05-07 DIAGNOSIS — M353 Polymyalgia rheumatica: Secondary | ICD-10-CM | POA: Diagnosis not present

## 2024-05-07 DIAGNOSIS — R2689 Other abnormalities of gait and mobility: Secondary | ICD-10-CM | POA: Diagnosis not present

## 2024-05-07 DIAGNOSIS — R531 Weakness: Secondary | ICD-10-CM | POA: Diagnosis not present

## 2024-05-07 DIAGNOSIS — Z0000 Encounter for general adult medical examination without abnormal findings: Secondary | ICD-10-CM | POA: Diagnosis not present

## 2024-05-07 DIAGNOSIS — I10 Essential (primary) hypertension: Secondary | ICD-10-CM | POA: Diagnosis not present

## 2024-05-07 DIAGNOSIS — R7303 Prediabetes: Secondary | ICD-10-CM | POA: Diagnosis not present

## 2024-05-07 DIAGNOSIS — E7800 Pure hypercholesterolemia, unspecified: Secondary | ICD-10-CM | POA: Diagnosis not present

## 2024-05-07 DIAGNOSIS — Z125 Encounter for screening for malignant neoplasm of prostate: Secondary | ICD-10-CM | POA: Diagnosis not present

## 2024-05-11 ENCOUNTER — Ambulatory Visit (HOSPITAL_BASED_OUTPATIENT_CLINIC_OR_DEPARTMENT_OTHER)
Admission: RE | Admit: 2024-05-11 | Discharge: 2024-05-11 | Disposition: A | Discharge status: Home / Self Care | Source: Ambulatory Visit | Attending: Rheumatology | Admitting: Rheumatology

## 2024-05-11 DIAGNOSIS — M353 Polymyalgia rheumatica: Secondary | ICD-10-CM | POA: Diagnosis not present

## 2024-05-11 DIAGNOSIS — Z0389 Encounter for observation for other suspected diseases and conditions ruled out: Secondary | ICD-10-CM | POA: Diagnosis not present

## 2024-05-11 DIAGNOSIS — M810 Age-related osteoporosis without current pathological fracture: Secondary | ICD-10-CM | POA: Diagnosis not present

## 2024-05-11 DIAGNOSIS — I10 Essential (primary) hypertension: Secondary | ICD-10-CM | POA: Diagnosis not present

## 2024-05-12 DIAGNOSIS — H401131 Primary open-angle glaucoma, bilateral, mild stage: Secondary | ICD-10-CM | POA: Diagnosis not present

## 2024-05-12 DIAGNOSIS — H18413 Arcus senilis, bilateral: Secondary | ICD-10-CM | POA: Diagnosis not present

## 2024-05-12 DIAGNOSIS — H353131 Nonexudative age-related macular degeneration, bilateral, early dry stage: Secondary | ICD-10-CM | POA: Diagnosis not present

## 2024-05-12 DIAGNOSIS — H26492 Other secondary cataract, left eye: Secondary | ICD-10-CM | POA: Diagnosis not present

## 2024-05-19 DIAGNOSIS — I2510 Atherosclerotic heart disease of native coronary artery without angina pectoris: Secondary | ICD-10-CM | POA: Diagnosis not present

## 2024-05-19 DIAGNOSIS — R932 Abnormal findings on diagnostic imaging of liver and biliary tract: Secondary | ICD-10-CM | POA: Diagnosis not present

## 2024-05-19 DIAGNOSIS — R911 Solitary pulmonary nodule: Secondary | ICD-10-CM | POA: Diagnosis not present

## 2024-05-20 DIAGNOSIS — H26492 Other secondary cataract, left eye: Secondary | ICD-10-CM | POA: Diagnosis not present

## 2024-05-21 DIAGNOSIS — M353 Polymyalgia rheumatica: Secondary | ICD-10-CM | POA: Diagnosis not present

## 2024-05-21 DIAGNOSIS — M7918 Myalgia, other site: Secondary | ICD-10-CM | POA: Diagnosis not present

## 2024-05-21 DIAGNOSIS — Z79899 Other long term (current) drug therapy: Secondary | ICD-10-CM | POA: Diagnosis not present

## 2024-05-21 DIAGNOSIS — M2560 Stiffness of unspecified joint, not elsewhere classified: Secondary | ICD-10-CM | POA: Diagnosis not present

## 2024-06-23 DIAGNOSIS — K080 Exfoliation of teeth due to systemic causes: Secondary | ICD-10-CM | POA: Diagnosis not present

## 2024-06-25 ENCOUNTER — Other Ambulatory Visit: Payer: Self-pay | Admitting: Gastroenterology

## 2024-06-25 DIAGNOSIS — K7689 Other specified diseases of liver: Secondary | ICD-10-CM

## 2024-07-02 DIAGNOSIS — L570 Actinic keratosis: Secondary | ICD-10-CM | POA: Diagnosis not present

## 2024-07-02 DIAGNOSIS — L821 Other seborrheic keratosis: Secondary | ICD-10-CM | POA: Diagnosis not present

## 2024-07-02 DIAGNOSIS — D2372 Other benign neoplasm of skin of left lower limb, including hip: Secondary | ICD-10-CM | POA: Diagnosis not present

## 2024-07-02 DIAGNOSIS — Z85828 Personal history of other malignant neoplasm of skin: Secondary | ICD-10-CM | POA: Diagnosis not present

## 2024-07-02 DIAGNOSIS — C44311 Basal cell carcinoma of skin of nose: Secondary | ICD-10-CM | POA: Diagnosis not present

## 2024-07-02 DIAGNOSIS — D692 Other nonthrombocytopenic purpura: Secondary | ICD-10-CM | POA: Diagnosis not present

## 2024-07-28 ENCOUNTER — Ambulatory Visit
Admission: RE | Admit: 2024-07-28 | Discharge: 2024-07-28 | Disposition: A | Source: Ambulatory Visit | Attending: Gastroenterology | Admitting: Gastroenterology

## 2024-07-28 DIAGNOSIS — K7689 Other specified diseases of liver: Secondary | ICD-10-CM | POA: Diagnosis not present

## 2024-07-28 DIAGNOSIS — N281 Cyst of kidney, acquired: Secondary | ICD-10-CM | POA: Diagnosis not present

## 2024-07-28 MED ORDER — GADOPICLENOL 0.5 MMOL/ML IV SOLN
7.0000 mL | Freq: Once | INTRAVENOUS | Status: AC | PRN
  Administered 2024-07-28: 7 mL via INTRAVENOUS

## 2024-08-04 DIAGNOSIS — C44311 Basal cell carcinoma of skin of nose: Secondary | ICD-10-CM | POA: Diagnosis not present

## 2024-08-12 DIAGNOSIS — I10 Essential (primary) hypertension: Secondary | ICD-10-CM | POA: Diagnosis not present

## 2024-08-12 DIAGNOSIS — R7303 Prediabetes: Secondary | ICD-10-CM | POA: Diagnosis not present
